# Patient Record
Sex: Female | Born: 1980
Health system: Southern US, Community
[De-identification: ages and names within clinical notes are randomized; demographics above are authoritative.]

## PROBLEM LIST (undated history)

## (undated) DIAGNOSIS — A6000 Herpesviral infection of urogenital system, unspecified: Secondary | ICD-10-CM

## (undated) DIAGNOSIS — D219 Benign neoplasm of connective and other soft tissue, unspecified: Secondary | ICD-10-CM

## (undated) DIAGNOSIS — N809 Endometriosis, unspecified: Secondary | ICD-10-CM

## (undated) HISTORY — DX: Herpesviral infection of urogenital system, unspecified: A60.00

## (undated) HISTORY — PX: DILATION AND CURETTAGE OF UTERUS: SHX78

## (undated) HISTORY — DX: Endometriosis, unspecified: N80.9

## (undated) HISTORY — PX: PELVIC LAPAROSCOPY: SHX162

## (undated) HISTORY — PX: ENDOMETRIAL ABLATION: SHX621

## (undated) HISTORY — DX: Benign neoplasm of connective and other soft tissue, unspecified: D21.9

---

## 2004-06-28 ENCOUNTER — Emergency Department: Payer: Self-pay | Admitting: Emergency Medicine

## 2004-06-29 ENCOUNTER — Emergency Department: Payer: Self-pay | Admitting: Emergency Medicine

## 2004-08-23 ENCOUNTER — Emergency Department: Payer: Self-pay | Admitting: Emergency Medicine

## 2005-08-17 ENCOUNTER — Observation Stay: Payer: Self-pay

## 2005-09-06 ENCOUNTER — Emergency Department: Payer: Self-pay | Admitting: Emergency Medicine

## 2005-10-28 ENCOUNTER — Observation Stay: Payer: Self-pay

## 2005-12-11 ENCOUNTER — Inpatient Hospital Stay: Payer: Self-pay | Admitting: Unknown Physician Specialty

## 2005-12-24 ENCOUNTER — Emergency Department: Payer: Self-pay | Admitting: Emergency Medicine

## 2006-04-23 ENCOUNTER — Emergency Department: Payer: Self-pay | Admitting: Emergency Medicine

## 2006-07-21 ENCOUNTER — Emergency Department: Payer: Self-pay | Admitting: Emergency Medicine

## 2007-10-03 ENCOUNTER — Emergency Department: Payer: Self-pay | Admitting: Emergency Medicine

## 2007-10-31 ENCOUNTER — Emergency Department: Payer: Self-pay | Admitting: Emergency Medicine

## 2008-02-14 ENCOUNTER — Emergency Department: Payer: Self-pay | Admitting: Emergency Medicine

## 2009-02-10 DIAGNOSIS — A6000 Herpesviral infection of urogenital system, unspecified: Secondary | ICD-10-CM | POA: Insufficient documentation

## 2009-07-16 DIAGNOSIS — J309 Allergic rhinitis, unspecified: Secondary | ICD-10-CM | POA: Insufficient documentation

## 2009-07-16 DIAGNOSIS — I10 Essential (primary) hypertension: Secondary | ICD-10-CM | POA: Insufficient documentation

## 2009-07-16 DIAGNOSIS — D649 Anemia, unspecified: Secondary | ICD-10-CM | POA: Insufficient documentation

## 2010-03-20 ENCOUNTER — Emergency Department (HOSPITAL_COMMUNITY): Admission: EM | Admit: 2010-03-20 | Discharge: 2010-03-20 | Payer: Self-pay | Admitting: Emergency Medicine

## 2010-06-07 ENCOUNTER — Ambulatory Visit: Payer: Self-pay | Admitting: Unknown Physician Specialty

## 2010-06-11 ENCOUNTER — Ambulatory Visit: Payer: Self-pay | Admitting: Unknown Physician Specialty

## 2010-06-15 LAB — PATHOLOGY REPORT

## 2010-12-05 LAB — URINALYSIS, ROUTINE W REFLEX MICROSCOPIC
Bilirubin Urine: NEGATIVE
Leukocytes, UA: NEGATIVE
Nitrite: NEGATIVE
Protein, ur: NEGATIVE mg/dL
Specific Gravity, Urine: 1.031 — ABNORMAL HIGH (ref 1.005–1.030)

## 2010-12-05 LAB — COMPREHENSIVE METABOLIC PANEL
ALT: 14 U/L (ref 0–35)
Albumin: 3.5 g/dL (ref 3.5–5.2)
Alkaline Phosphatase: 69 U/L (ref 39–117)
CO2: 27 mEq/L (ref 19–32)
Calcium: 8.7 mg/dL (ref 8.4–10.5)
Chloride: 108 mEq/L (ref 96–112)
Total Bilirubin: 0.3 mg/dL (ref 0.3–1.2)

## 2010-12-05 LAB — DIFFERENTIAL
Basophils Absolute: 0 10*3/uL (ref 0.0–0.1)
Eosinophils Absolute: 0.1 10*3/uL (ref 0.0–0.7)
Monocytes Relative: 6 % (ref 3–12)
Neutrophils Relative %: 85 % — ABNORMAL HIGH (ref 43–77)

## 2010-12-05 LAB — URINE MICROSCOPIC-ADD ON

## 2010-12-05 LAB — CBC
MCH: 27.7 pg (ref 26.0–34.0)
MCHC: 32.9 g/dL (ref 30.0–36.0)
MCV: 84.2 fL (ref 78.0–100.0)
RBC: 4.63 MIL/uL (ref 3.87–5.11)
RDW: 16.7 % — ABNORMAL HIGH (ref 11.5–15.5)

## 2010-12-05 LAB — POCT PREGNANCY, URINE: Preg Test, Ur: NEGATIVE

## 2012-03-12 ENCOUNTER — Emergency Department: Payer: Self-pay | Admitting: Emergency Medicine

## 2012-03-12 LAB — COMPREHENSIVE METABOLIC PANEL
BUN: 12 mg/dL (ref 7–18)
Co2: 27 mmol/L (ref 21–32)
Creatinine: 0.93 mg/dL (ref 0.60–1.30)
EGFR (African American): >60
EGFR (Non-African Amer.): >60
Glucose: 88 mg/dL (ref 65–99)
Osmolality: 269 (ref 275–301)
Potassium: 3.7 mmol/L (ref 3.5–5.1)
SGPT (ALT): 16 U/L
Sodium: 135 mmol/L — ABNORMAL LOW (ref 136–145)

## 2012-03-12 LAB — CBC
HGB: 13.1 g/dL (ref 12.0–16.0)
MCH: 28.9 pg (ref 26.0–34.0)
Platelet: 194 10*3/uL (ref 150–440)
RDW: 12.7 % (ref 11.5–14.5)

## 2012-03-12 LAB — TSH: Thyroid Stimulating Horm: 3.01 u[IU]/mL

## 2012-03-12 LAB — TROPONIN I: Troponin-I: <0.02 ng/mL

## 2013-09-19 HISTORY — PX: OTHER SURGICAL HISTORY: SHX169

## 2014-04-28 ENCOUNTER — Ambulatory Visit: Payer: Self-pay | Admitting: Obstetrics and Gynecology

## 2014-04-28 LAB — CBC
HCT: 41.1 % (ref 35.0–47.0)
HGB: 13.2 g/dL (ref 12.0–16.0)
MCH: 28.1 pg (ref 26.0–34.0)
MCHC: 32.2 g/dL (ref 32.0–36.0)
MCV: 87 fL (ref 80–100)
Platelet: 193 10*3/uL (ref 150–440)
RBC: 4.7 10*6/uL (ref 3.80–5.20)
RDW: 13 % (ref 11.5–14.5)
WBC: 4 10*3/uL (ref 3.6–11.0)

## 2014-04-28 LAB — BASIC METABOLIC PANEL
ANION GAP: 4 — AB (ref 7–16)
BUN: 8 mg/dL (ref 7–18)
CHLORIDE: 106 mmol/L (ref 98–107)
Calcium, Total: 8.8 mg/dL (ref 8.5–10.1)
Co2: 28 mmol/L (ref 21–32)
Creatinine: 1 mg/dL (ref 0.60–1.30)
EGFR (African American): >60
Glucose: 91 mg/dL (ref 65–99)
OSMOLALITY: 274 (ref 275–301)
POTASSIUM: 3.8 mmol/L (ref 3.5–5.1)
Sodium: 138 mmol/L (ref 136–145)

## 2014-05-08 ENCOUNTER — Ambulatory Visit: Payer: Self-pay | Admitting: Obstetrics and Gynecology

## 2014-05-09 LAB — HEMATOCRIT: HCT: 34.7 % — ABNORMAL LOW (ref 35.0–47.0)

## 2014-05-11 LAB — PATHOLOGY REPORT

## 2015-01-01 LAB — CBC AND DIFFERENTIAL
HEMATOCRIT: 42 % (ref 36–46)
Hemoglobin: 14.1 g/dL (ref 12.0–16.0)
NEUTROS ABS: 2 /uL
PLATELETS: 240 10*3/uL (ref 150–399)
WBC: 4.4 10^3/mL

## 2015-01-01 LAB — HEPATIC FUNCTION PANEL
ALT: 10 U/L (ref 7–35)
AST: 21 U/L (ref 13–35)
Alkaline Phosphatase: 69 U/L (ref 25–125)

## 2015-01-01 LAB — LIPID PANEL
CHOLESTEROL: 169 mg/dL (ref 0–200)
HDL: 40 mg/dL (ref 35–70)
LDL CALC: 111 mg/dL
LDL/HDL RATIO: 2.8
TRIGLYCERIDES: 89 mg/dL (ref 40–160)

## 2015-01-01 LAB — BASIC METABOLIC PANEL
BUN: 8 mg/dL (ref 4–21)
Creatinine: 0.8 mg/dL (ref 0.5–1.1)
Glucose: 97 mg/dL
Potassium: 4.1 mmol/L (ref 3.4–5.3)
SODIUM: 135 mmol/L — AB (ref 137–147)

## 2015-01-01 LAB — TSH: TSH: 1.39 u[IU]/mL (ref 0.41–5.90)

## 2015-01-10 NOTE — Op Note (Signed)
PATIENT NAME:  Daisy Foley, Daisy Foley MR#:  [id number] DATE OF BIRTH:  August 18, 1981  DATE OF PROCEDURE:  05/08/2014  PREOPERATIVE DIAGNOSIS: Pelvic pain and large fibroid causing metromenorrhagia unresponsive to medical management and desiring permanent sterility.   POSTOPERATIVE DIAGNOSIS:  Pelvic pain and large fibroid causing metromenorrhagia unresponsive to medical management and desiring permanent sterility.    PROCEDURE PERFORMED: Laparoscopic supracervical hysterectomy with minilaparotomy and bilateral salpingectomy.   ESTIMATED BLOOD LOSS: Approximately 100 mL.   SURGEON: Delsa Sale, MD    ASSISTANT: Barnett Applebaum, MD    FINDINGS: Large fibroid uterus with solitary fibroid, normal tubes and ovaries bilaterally.   DESCRIPTION OF PROCEDURE:  The patient was taken to the Operating Room and placed in supine position. After adequate general endotracheal anesthesia was instilled, the patient was prepped and draped in the usual sterile fashion.  A side-opening speculum was placed in the patient's vagina and the anterior lip of the cervix was grasped with a single-tooth tenaculum. Hulka tenaculum was placed, speculum was removed from the vagina.  Attention was turned to the umbilicus which was injected with Marcaine.  Incision was made.  Veress needle was placed. Hang drop test, fluid instillation test, and fluid aspiration test showed proper placement of Veress needle. CO2 was placed on low flow.  When tympany was heard around the liver, CO2 was placed on high flow. The patient was then placed in Trendelenburg.  An 11 mm trocar port was placed in the umbilicus and two 5-mm trocars were placed on the left and right lower quadrant. The tubes were doused with Marcaine.  Harmonic scalpel was placed.  First the tube was released from the peritoneum, 1st on the right and then on the left.  Serial bites were taken down the sides of the uterus with the Harmonic scalpel.  The uterine artery was grasped with the  Kleppinger and cauterized.  The Harmonic scalpel then used to cut across the base of the uterus at the uterocervical junction and the patient was then placed flat and supine and a small mini laparotomy incision was made approximately 2 fingerbreadths above the pubic symphysis and carried sharply down to the fascia.  Fascia was nicked in the midline. The incision was extended in a superolateral manner. The muscle bellies were identified and sharply and bluntly dissected off the rectus fascia superiorly. The peritoneum was grasped and entered. The uterus was grasped with the surgeon's fingers and pulled out through the incision.  The muscle bellies were approximated with Vicryl suture.  The fascia was closed with a running Vicryl suture.  Then 4-0 Monocryl was used to approximate the skin edges. The pelvis was copiously irrigated with warm normal saline to the laparoscopy port and the pelvis was found to be hemostatic.  The patient was then allowed to go supine.  Clear urine was noted in the Foley bag.  Hulka tenaculum had been removed from the cervix with removal of the uterus. 4-0 Monocryl was used to close the skin edges.  Dermabond, 2 x 2's and Tegaderm were placed as bandages.  The patient was then taken to recovery after having tolerated the procedure well.      ____________________________ Delsa Sale, MD cck:DT D: 05/13/2014 13:03:35 ET T: 05/13/2014 19:58:12 ET JOB#: [id number]  cc: Delsa Sale, MD, <Dictator> Delsa Sale MD ELECTRONICALLY SIGNED 05/13/2014 20:35

## 2015-06-29 ENCOUNTER — Encounter: Payer: Self-pay | Admitting: Family Medicine

## 2015-06-29 ENCOUNTER — Ambulatory Visit (INDEPENDENT_AMBULATORY_CARE_PROVIDER_SITE_OTHER): Payer: 59 | Admitting: Family Medicine

## 2015-06-29 VITALS — BP 124/70 | HR 68 | Temp 98.1°F | Resp 16 | Ht 72.0 in | Wt 234.0 lb

## 2015-06-29 DIAGNOSIS — G43909 Migraine, unspecified, not intractable, without status migrainosus: Secondary | ICD-10-CM | POA: Insufficient documentation

## 2015-06-29 DIAGNOSIS — K2100 Gastro-esophageal reflux disease with esophagitis, without bleeding: Secondary | ICD-10-CM | POA: Insufficient documentation

## 2015-06-29 DIAGNOSIS — M542 Cervicalgia: Secondary | ICD-10-CM

## 2015-06-29 DIAGNOSIS — E039 Hypothyroidism, unspecified: Secondary | ICD-10-CM | POA: Insufficient documentation

## 2015-06-29 DIAGNOSIS — F988 Other specified behavioral and emotional disorders with onset usually occurring in childhood and adolescence: Secondary | ICD-10-CM | POA: Insufficient documentation

## 2015-06-29 DIAGNOSIS — E7800 Pure hypercholesterolemia, unspecified: Secondary | ICD-10-CM | POA: Insufficient documentation

## 2015-06-29 DIAGNOSIS — F329 Major depressive disorder, single episode, unspecified: Secondary | ICD-10-CM | POA: Insufficient documentation

## 2015-06-29 DIAGNOSIS — F32A Depression, unspecified: Secondary | ICD-10-CM | POA: Insufficient documentation

## 2015-06-29 MED ORDER — HYDROCODONE-ACETAMINOPHEN 5-325 MG PO TABS: 1.0000 | ORAL_TABLET | ORAL | Status: DC | PRN

## 2015-06-29 MED ORDER — CYCLOBENZAPRINE HCL 10 MG PO TABS: 10.0000 mg | ORAL_TABLET | Freq: Three times a day (TID) | ORAL | Status: DC | PRN

## 2015-06-29 MED ORDER — PREDNISONE 10 MG PO TABS: 10.0000 mg | ORAL_TABLET | Freq: Every day | ORAL | Status: DC

## 2015-06-29 NOTE — Progress Notes (Signed)
Patient ID: Daisy Foley, female   DOB: 1980/10/09, 34 y.o.   MRN: [id number]   Daisy Foley  MRN: [id number] DOB: [birth year]  Subjective:  HPI   1. Neck pain Patient is a 34 year old female who presents today for evalulation of neck/bilateral shoulder pain.  The patient is massage therapist 3 days per week.  She notes that she had been having some right sided shoulder pain (dominant arm) and had a massage 6 days ago.  After the massage she states she felt well.  On Thursday she went to work and had 5 patient, 3 of which were deep tissue massages.  She notes that at 3 am on Friday morning she was asleep and rolled over on her left side.  She was awakened with sever sharp pain that went from her right shoulder to the left shoulder.  She has been in significant pain since that time.  She states that she tried ice, heat, massage and marijuana and nothing has helped the pain.  She is unable to turn her head side to side, up and down and can only lift her arms a little.  The pain does not radiate down her back.  Patient Active Problem List   Diagnosis Date Noted  . ADD (attention deficit disorder) 06/29/2015  . Clinical depression 06/29/2015  . Esophagitis, reflux 06/29/2015  . Adult hypothyroidism 06/29/2015  . Headache, migraine 06/29/2015  . Pure hypercholesterolemia 06/29/2015  . Allergic rhinitis 07/16/2009  . Absolute anemia 07/16/2009  . Essential (primary) hypertension 07/16/2009  . Genital herpes 02/10/2009    No past medical history on file.  Social History   Social History  . Marital Status: Divorced    Spouse Name: N/A  . Number of Children: N/A  . Years of Education: N/A   Occupational History  . Not on file.   Social History Main Topics  . Smoking status: Never Smoker   . Smokeless tobacco: Not on file  . Alcohol Use: No     Comment: 3 per month  . Drug Use: No     Comment: marijuana use twice this past weekend  . Sexual Activity: Not on file   Other Topics  Concern  . Not on file   Social History Narrative    Outpatient Prescriptions Prior to Visit  Medication Sig Dispense Refill  . acyclovir (ZOVIRAX) 400 MG tablet Take by mouth.     No facility-administered medications prior to visit.    No Known Allergies  Review of Systems  Constitutional: Positive for chills and malaise/fatigue. Negative for fever.  Respiratory: Negative for cough, shortness of breath and wheezing.   Cardiovascular: Positive for orthopnea. Negative for chest pain, palpitations and leg swelling.  Gastrointestinal: Negative.   Musculoskeletal: Positive for myalgias, joint pain (shoulders) and neck pain. Negative for back pain.  Neurological: Negative.  Negative for weakness.  Psychiatric/Behavioral: Negative.    Objective:  BP 124/70 mmHg  Pulse 68  Temp(Src) 98.1 F (36.7 C) (Oral)  Resp 16  Ht 6' (1.829 m)  Wt 234 lb (106.142 kg)  BMI 31.73 kg/m2  Physical Exam  Constitutional: She is oriented to person, place, and time and well-developed, well-nourished, and in no distress.  HENT:  Head: Normocephalic and atraumatic.  Right Ear: External ear normal.  Left Ear: External ear normal.  Nose: Nose normal.  Eyes: Conjunctivae are normal.  Neck: Neck supple.  Cardiovascular: Normal rate, regular rhythm and normal heart sounds.   Pulmonary/Chest: Effort normal and breath sounds normal.  Abdominal: Soft.  Neurological: She is alert and oriented to person, place, and time.  Skin: Skin is warm and dry.  Psychiatric: Mood, memory, affect and judgment normal.    Assessment and Plan :  Neck pain 1. Neck pain Acute Muscular Strain/doubt cervical DDD. - predniSONE (DELTASONE) 10 MG tablet; Take 1 tablet (10 mg total) by mouth daily with breakfast.  Dispense: 42 tablet; Refill: 0 - cyclobenzaprine (FLEXERIL) 10 MG tablet; Take 1 tablet (10 mg total) by mouth 3 (three) times daily as needed for muscle spasms.  Dispense: 55 tablet; Refill: 0 -  HYDROcodone-acetaminophen (NORCO/VICODIN) 5-325 MG tablet; Take 1 tablet by mouth every 4 (four) hours as needed for moderate pain.  Dispense: 55 tablet; Refill: 0 I have done the exam and reviewed the above chart and it is accurate to the best of my knowledge.  Miguel Aschoff MD [facility] 06/29/2015 2:08 PM

## 2015-07-03 ENCOUNTER — Encounter: Payer: Self-pay | Admitting: Family Medicine

## 2015-08-11 ENCOUNTER — Encounter: Payer: Self-pay | Admitting: Family Medicine

## 2015-08-11 ENCOUNTER — Ambulatory Visit (INDEPENDENT_AMBULATORY_CARE_PROVIDER_SITE_OTHER): Payer: 59 | Admitting: Family Medicine

## 2015-08-11 VITALS — BP 112/72 | HR 74 | Temp 97.9°F | Resp 10 | Wt 240.0 lb

## 2015-08-11 DIAGNOSIS — R599 Enlarged lymph nodes, unspecified: Secondary | ICD-10-CM

## 2015-08-11 DIAGNOSIS — R590 Localized enlarged lymph nodes: Secondary | ICD-10-CM

## 2015-08-11 DIAGNOSIS — M542 Cervicalgia: Secondary | ICD-10-CM | POA: Diagnosis not present

## 2015-08-11 MED ORDER — CYCLOBENZAPRINE HCL 10 MG PO TABS: 10.0000 mg | ORAL_TABLET | Freq: Three times a day (TID) | ORAL | Status: DC | PRN

## 2015-08-11 NOTE — Progress Notes (Signed)
Patient ID: Daisy Foley, female   DOB: 03/05/1981, 34 y.o.   MRN: [id number]    Subjective:  HPI  Patient is here to follow up on neck pain. She was last seen on October 10th. At that time she was given Prednisone, Flexeril and Vicodin. Patient finished Prednisone course, used Vicodin a few times. She has been taking Flexeril some and when she takes it neck pain is better. Pain is located on the left side of the neck. Feels swollen in that area. Saturday November 19th she had a headache and felt a little dizzy, her colleague at work gave her a massage in the neck area and it actually made it worse. She has noticed headaches before Saturday a couple times and they would last for about 3 hours -sometimes she will wait it out and sometimes she will take Advil or something. She is trying not to take Flexeril daily and not become addicted to it.  Prior to Admission medications   Medication Sig Start Date End Date Taking? Authorizing Provider  cyclobenzaprine (FLEXERIL) 10 MG tablet Take 1 tablet (10 mg total) by mouth 3 (three) times daily as needed for muscle spasms. 06/29/15  Yes Henderson Frampton Maceo Pro., MD  HYDROcodone-acetaminophen (NORCO/VICODIN) 5-325 MG tablet Take 1 tablet by mouth every 4 (four) hours as needed for moderate pain. 06/29/15  Yes Skylah Delauter Maceo Pro., MD    Patient Active Problem List   Diagnosis Date Noted  . ADD (attention deficit disorder) 06/29/2015  . Clinical depression 06/29/2015  . Esophagitis, reflux 06/29/2015  . Adult hypothyroidism 06/29/2015  . Headache, migraine 06/29/2015  . Pure hypercholesterolemia 06/29/2015  . Allergic rhinitis 07/16/2009  . Absolute anemia 07/16/2009  . Essential (primary) hypertension 07/16/2009  . Genital herpes 02/10/2009    No past medical history on file.  Social History   Social History  . Marital Status: Divorced    Spouse Name: N/A  . Number of Children: N/A  . Years of Education: N/A   Occupational History  .  Not on file.   Social History Main Topics  . Smoking status: Never Smoker   . Smokeless tobacco: Never Used  . Alcohol Use: No     Comment: 3 per month  . Drug Use: No     Comment: marijuana use twice this past weekend  . Sexual Activity: Not on file   Other Topics Concern  . Not on file   Social History Narrative    No Known Allergies  Review of Systems  Constitutional: Negative.   HENT: Negative.   Respiratory: Negative.   Cardiovascular: Negative.   Gastrointestinal: Negative.   Musculoskeletal: Positive for neck pain.  Skin: Negative.   Neurological: Negative.   Endo/Heme/Allergies: Negative.   Psychiatric/Behavioral: Negative.     Immunization History  Administered Date(s) Administered  . Tdap 08/30/2012   Objective:  BP 112/72 mmHg  Pulse 74  Temp(Src) 97.9 F (36.6 C)  Resp 10  Wt 240 lb (108.863 kg)  Physical Exam  Constitutional: She is oriented to person, place, and time and well-developed, well-nourished, and in no distress.  HENT:  Head: Normocephalic and atraumatic.  Right Ear: External ear normal.  Left Ear: External ear normal.  Nose: Nose normal.  Eyes: Conjunctivae are normal.  Neck: Neck supple. No tracheal deviation present. No thyromegaly present.  She has 3 small mobile posterior cervical lymph node in the upper posterior chain on the left.  Cardiovascular: Normal rate, regular rhythm and normal heart sounds.  Pulmonary/Chest: Effort normal and breath sounds normal.  Lymphadenopathy:    She has cervical adenopathy.  Neurological: She is alert and oriented to person, place, and time.  Skin: Skin is warm and dry.  Psychiatric: Mood, memory, affect and judgment normal.    Lab Results  Component Value Date   WBC 4.4 01/01/2015   HGB 14.1 01/01/2015   HCT 42 01/01/2015   PLT 240 01/01/2015   GLUCOSE 91 04/28/2014   CHOL 169 01/01/2015   TRIG 89 01/01/2015   HDL 40 01/01/2015   LDLCALC 111 01/01/2015   TSH 1.39 01/01/2015     CMP     Component Value Date/Time   NA 135* 01/01/2015   NA 138 04/28/2014 1107   NA 138 03/20/2010 1243   K 4.1 01/01/2015   K 3.8 04/28/2014 1107   CL 106 04/28/2014 1107   CL 108 03/20/2010 1243   CO2 28 04/28/2014 1107   CO2 27 03/20/2010 1243   GLUCOSE 91 04/28/2014 1107   GLUCOSE 101* 03/20/2010 1243   BUN 8 01/01/2015   BUN 8 04/28/2014 1107   BUN 6 03/20/2010 1243   CREATININE 0.8 01/01/2015   CREATININE 1.00 04/28/2014 1107   CREATININE 0.64 03/20/2010 1243   CALCIUM 8.8 04/28/2014 1107   CALCIUM 8.7 03/20/2010 1243   PROT 9.1* 03/12/2012 0613   PROT 8.6* 03/20/2010 1243   ALBUMIN 3.7 03/12/2012 0613   ALBUMIN 3.5 03/20/2010 1243   AST 21 01/01/2015   AST 21 03/12/2012 0613   ALT 10 01/01/2015   ALT 16 03/12/2012 0613   ALKPHOS 69 01/01/2015   ALKPHOS 85 03/12/2012 0613   BILITOT 0.2 03/12/2012 0613   BILITOT 0.3 03/20/2010 1243   GFRNONAA >60 04/28/2014 1107   GFRNONAA NOT CALCULATED 03/20/2010 1243   GFRAA >60 04/28/2014 1107   GFRAA  03/20/2010 1243    NOT CALCULATED        The eGFR has been calculated using the MDRD equation. This calculation has not been validated in all clinical situations. eGFR's persistently <60 mL/min signify possible Chronic Kidney Disease.    Assessment and Plan :  1. Neck pain Still present but markedly improved.  2. Posterior cervical lymphadenopathy/left From viral infection usually. Continue Flexeril as needed. May need to check labs on the next visit. If symptoms improve patient can cancel follow up appointment. These feel to be benign nodes. I have done the exam and reviewed the above chart and it is accurate to the best of my knowledge.  Patient was seen and examined by Dr. Eulas Post and note was scribed by Theressa Millard, RMA.    Miguel Aschoff MD [facility] 08/11/2015 4:26 PM

## 2015-09-09 ENCOUNTER — Ambulatory Visit: Payer: 59 | Admitting: Family Medicine

## 2015-10-08 ENCOUNTER — Other Ambulatory Visit: Payer: Self-pay

## 2016-02-10 ENCOUNTER — Other Ambulatory Visit: Payer: Self-pay | Admitting: Family Medicine

## 2016-11-23 ENCOUNTER — Ambulatory Visit (INDEPENDENT_AMBULATORY_CARE_PROVIDER_SITE_OTHER): Payer: 59 | Admitting: Family Medicine

## 2016-11-23 VITALS — BP 138/84 | HR 68 | Temp 98.6°F | Resp 16 | Wt 228.0 lb

## 2016-11-23 DIAGNOSIS — A6004 Herpesviral vulvovaginitis: Secondary | ICD-10-CM | POA: Diagnosis not present

## 2016-11-23 DIAGNOSIS — T63301A Toxic effect of unspecified spider venom, accidental (unintentional), initial encounter: Secondary | ICD-10-CM

## 2016-11-23 DIAGNOSIS — L03114 Cellulitis of left upper limb: Secondary | ICD-10-CM | POA: Diagnosis not present

## 2016-11-23 MED ORDER — ACYCLOVIR 400 MG PO TABS: 400.0000 mg | ORAL_TABLET | Freq: Two times a day (BID) | ORAL | 5 refills | Status: DC

## 2016-11-23 MED ORDER — AMOXICILLIN-POT CLAVULANATE 875-125 MG PO TABS: 1.0000 | ORAL_TABLET | Freq: Two times a day (BID) | ORAL | 0 refills | Status: DC

## 2016-11-23 NOTE — Progress Notes (Signed)
Daisy Foley  MRN: [id number] DOB: 12-01-1980  Subjective:  HPI   The patient is a 36 year old female who presents for evaluation of a spider bite.  She states she was asleep in the early morning hours of Tuesday (1 day) ago when she felt something on her arm.  She states she did not get up and didn't even wake up all the way but felt it and swatted at it.  When she got to work that morning she had a co-worker to check the area as it was hurting  and itching her.  It was noted to be red and swollen.  The drew a circle around the size of the reddened area.  She states that today it is bigger, more pain, throbbing and has heat in it.  She was not able to identify the insect.  Patient Active Problem List   Diagnosis Date Noted  . ADD (attention deficit disorder) 06/29/2015  . Clinical depression 06/29/2015  . Esophagitis, reflux 06/29/2015  . Adult hypothyroidism 06/29/2015  . Headache, migraine 06/29/2015  . Pure hypercholesterolemia 06/29/2015  . Allergic rhinitis 07/16/2009  . Absolute anemia 07/16/2009  . Essential (primary) hypertension 07/16/2009  . Genital herpes 02/10/2009    No past medical history on file.  Social History   Social History  . Marital status: Divorced    Spouse name: N/A  . Number of children: N/A  . Years of education: N/A   Occupational History  . Not on file.   Social History Main Topics  . Smoking status: Never Smoker  . Smokeless tobacco: Never Used  . Alcohol use No     Comment: 3 per month  . Drug use: No     Comment: marijuana use twice this past weekend  . Sexual activity: Not on file   Other Topics Concern  . Not on file   Social History Narrative  . No narrative on file    Outpatient Encounter Prescriptions as of 11/23/2016  Medication Sig  . acyclovir (ZOVIRAX) 400 MG tablet TAKE 1 TABLET BY MOUTH TWICE DAILY  . [DISCONTINUED] cyclobenzaprine (FLEXERIL) 10 MG tablet Take 1 tablet (10 mg total) by mouth 3 (three) times daily  as needed for muscle spasms.  . [DISCONTINUED] HYDROcodone-acetaminophen (NORCO/VICODIN) 5-325 MG tablet Take 1 tablet by mouth every 4 (four) hours as needed for moderate pain.   No facility-administered encounter medications on file as of 11/23/2016.     No Known Allergies  Review of Systems  Constitutional: Negative for chills, fever and malaise/fatigue.  Respiratory: Negative for cough, shortness of breath and wheezing.   Cardiovascular: Negative for chest pain, palpitations and orthopnea.  Skin: Positive for itching. Negative for rash.       Red and swollen on left, medial upper arm  Neurological: Negative for weakness.    Objective:  BP 138/84 (BP Location: Right Arm, Patient Position: Sitting, Cuff Size: Normal)   Pulse 68   Temp 98.6 F (37 C) (Oral)   Resp 16   Wt 228 lb (103.4 kg)   BMI 30.92 kg/m   Physical Exam  Constitutional: She is well-developed, well-nourished, and in no distress.  Neck:  6 x 5 ' indurated, erythematous, and warm to the touch aerea just medial to the left elbow.  Cardiovascular: Normal rate, regular rhythm and normal heart sounds.   Pulmonary/Chest: Effort normal and breath sounds normal.    Assessment and Plan :   1. Cellulitis of left upper extremity  - amoxicillin-clavulanate (  AUGMENTIN) 875-125 MG tablet; Take 1 tablet by mouth 2 (two) times daily.  Dispense: 20 tablet; Refill: 0  2. Spider bite wound, accidental or unintentional, initial encounter  3. Herpes simplex vulvovaginitis    HPI, Exam and A&P Transcribed under the direction and in the presence of [name]., MD. Electronically Signed: Althea Charon, RMA I have done the exam and reviewed the chart and it is accurate to the best of my knowledge. Development worker, community has been used and  any errors in dictation or transcription are unintentional. Miguel Aschoff M.D. [facility]

## 2016-11-24 ENCOUNTER — Ambulatory Visit: Payer: Self-pay | Admitting: Family Medicine

## 2016-12-29 ENCOUNTER — Encounter: Payer: Self-pay | Admitting: Obstetrics and Gynecology

## 2016-12-29 ENCOUNTER — Ambulatory Visit (INDEPENDENT_AMBULATORY_CARE_PROVIDER_SITE_OTHER): Payer: 59 | Admitting: Obstetrics and Gynecology

## 2016-12-29 VITALS — BP 128/81 | HR 94 | Ht 72.0 in | Wt 225.4 lb

## 2016-12-29 DIAGNOSIS — N939 Abnormal uterine and vaginal bleeding, unspecified: Secondary | ICD-10-CM

## 2016-12-29 DIAGNOSIS — Z202 Contact with and (suspected) exposure to infections with a predominantly sexual mode of transmission: Secondary | ICD-10-CM

## 2016-12-29 DIAGNOSIS — R599 Enlarged lymph nodes, unspecified: Secondary | ICD-10-CM | POA: Insufficient documentation

## 2016-12-29 DIAGNOSIS — Z01419 Encounter for gynecological examination (general) (routine) without abnormal findings: Secondary | ICD-10-CM

## 2016-12-29 MED ORDER — DOXYCYCLINE HYCLATE 100 MG PO CAPS: 100.0000 mg | ORAL_CAPSULE | Freq: Two times a day (BID) | ORAL | 0 refills | Status: DC

## 2016-12-29 NOTE — Patient Instructions (Signed)
1. Pap smear and STD testing is obtained 2. Self breast awareness is encouraged 3. Continue with healthy eating and exercise with controlled weight loss 4. Safe sex practices are encouraged 5. Return in 3 months for follow-up on abnormal uterine bleeding and right axillary lymph node enlargement 6. Doxycycline 100 mg twice a day for 14 days is prescribed 7. Return in 1 year for annual exam  Health Maintenance, Female Adopting a healthy lifestyle and getting preventive care can go a long way to promote health and wellness. Talk with your health care provider about what schedule of regular examinations is right for you. This is a good chance for you to check in with your provider about disease prevention and staying healthy. In between checkups, there are plenty of things you can do on your own. Experts have done a lot of research about which lifestyle changes and preventive measures are most likely to keep you healthy. Ask your health care provider for more information. Weight and diet Eat a healthy diet  Be sure to include plenty of vegetables, fruits, low-fat dairy products, and lean protein.  Do not eat a lot of foods high in solid fats, added sugars, or salt.  Get regular exercise. This is one of the most important things you can do for your health.  Most adults should exercise for at least 150 minutes each week. The exercise should increase your heart rate and make you sweat (moderate-intensity exercise).  Most adults should also do strengthening exercises at least twice a week. This is in addition to the moderate-intensity exercise. Maintain a healthy weight  Body mass index (BMI) is a measurement that can be used to identify possible weight problems. It estimates body fat based on height and weight. Your health care provider can help determine your BMI and help you achieve or maintain a healthy weight.  For females 37 years of age and older:  A BMI below 18.5 is considered  underweight.  A BMI of 18.5 to 24.9 is normal.  A BMI of 25 to 29.9 is considered overweight.  A BMI of 30 and above is considered obese. Watch levels of cholesterol and blood lipids  You should start having your blood tested for lipids and cholesterol at 34 years of age, then have this test every 5 years.  You may need to have your cholesterol levels checked more often if:  Your lipid or cholesterol levels are high.  You are older than 34 years of age.  You are at high risk for heart disease. Cancer screening Lung Cancer  Lung cancer screening is recommended for adults 60-72 years old who are at high risk for lung cancer because of a history of smoking.  A yearly low-dose CT scan of the lungs is recommended for people who:  Currently smoke.  Have quit within the past 15 years.  Have at least a 30-pack-year history of smoking. A pack year is smoking an average of one pack of cigarettes a day for 1 year.  Yearly screening should continue until it has been 15 years since you quit.  Yearly screening should stop if you develop a health problem that would prevent you from having lung cancer treatment. Breast Cancer  Practice breast self-awareness. This means understanding how your breasts normally appear and feel.  It also means doing regular breast self-exams. Let your health care provider know about any changes, no matter how small.  If you are in your 20s or 30s, you should have a clinical  breast exam (CBE) by a health care provider every 1-3 years as part of a regular health exam.  If you are 105 or older, have a CBE every year. Also consider having a breast X-ray (mammogram) every year.  If you have a family history of breast cancer, talk to your health care provider about genetic screening.  If you are at high risk for breast cancer, talk to your health care provider about having an MRI and a mammogram every year.  Breast cancer gene (BRCA) assessment is recommended  for women who have family members with BRCA-related cancers. BRCA-related cancers include:  Breast.  Ovarian.  Tubal.  Peritoneal cancers.  Results of the assessment will determine the need for genetic counseling and BRCA1 and BRCA2 testing. Cervical Cancer  Your health care provider may recommend that you be screened regularly for cancer of the pelvic organs (ovaries, uterus, and vagina). This screening involves a pelvic examination, including checking for microscopic changes to the surface of your cervix (Pap test). You may be encouraged to have this screening done every 3 years, beginning at age 67.  For women ages 1-65, health care providers may recommend pelvic exams and Pap testing every 3 years, or they may recommend the Pap and pelvic exam, combined with testing for human papilloma virus (HPV), every 5 years. Some types of HPV increase your risk of cervical cancer. Testing for HPV may also be done on women of any age with unclear Pap test results.  Other health care providers may not recommend any screening for nonpregnant women who are considered low risk for pelvic cancer and who do not have symptoms. Ask your health care provider if a screening pelvic exam is right for you.  If you have had past treatment for cervical cancer or a condition that could lead to cancer, you need Pap tests and screening for cancer for at least 20 years after your treatment. If Pap tests have been discontinued, your risk factors (such as having a new sexual partner) need to be reassessed to determine if screening should resume. Some women have medical problems that increase the chance of getting cervical cancer. In these cases, your health care provider may recommend more frequent screening and Pap tests. Colorectal Cancer  This type of cancer can be detected and often prevented.  Routine colorectal cancer screening usually begins at 34 years of age and continues through 34 years of age.  Your health  care provider may recommend screening at an earlier age if you have risk factors for colon cancer.  Your health care provider may also recommend using home test kits to check for hidden blood in the stool.  A small camera at the end of a tube can be used to examine your colon directly (sigmoidoscopy or colonoscopy). This is done to check for the earliest forms of colorectal cancer.  Routine screening usually begins at age 26.  Direct examination of the colon should be repeated every 5-10 years through 34 years of age. However, you may need to be screened more often if early forms of precancerous polyps or small growths are found. Skin Cancer  Check your skin from head to toe regularly.  Tell your health care provider about any new moles or changes in moles, especially if there is a change in a mole's shape or color.  Also tell your health care provider if you have a mole that is larger than the size of a pencil eraser.  Always use sunscreen. Apply sunscreen liberally  and repeatedly throughout the day.  Protect yourself by wearing long sleeves, pants, a wide-brimmed hat, and sunglasses whenever you are outside. Heart disease, diabetes, and high blood pressure  High blood pressure causes heart disease and increases the risk of stroke. High blood pressure is more likely to develop in:  People who have blood pressure in the high end of the normal range (130-139/85-89 mm Hg).  People who are overweight or obese.  People who are African American.  If you are 67-60 years of age, have your blood pressure checked every 3-5 years. If you are 50 years of age or older, have your blood pressure checked every year. You should have your blood pressure measured twice-once when you are at a hospital or clinic, and once when you are not at a hospital or clinic. Record the average of the two measurements. To check your blood pressure when you are not at a hospital or clinic, you can use:  An automated  blood pressure machine at a pharmacy.  A home blood pressure monitor.  If you are between 71 years and 38 years old, ask your health care provider if you should take aspirin to prevent strokes.  Have regular diabetes screenings. This involves taking a blood sample to check your fasting blood sugar level.  If you are at a normal weight and have a low risk for diabetes, have this test once every three years after 34 years of age.  If you are overweight and have a high risk for diabetes, consider being tested at a younger age or more often. Preventing infection Hepatitis B  If you have a higher risk for hepatitis B, you should be screened for this virus. You are considered at high risk for hepatitis B if:  You were born in a country where hepatitis B is common. Ask your health care provider which countries are considered high risk.  Your parents were born in a high-risk country, and you have not been immunized against hepatitis B (hepatitis B vaccine).  You have HIV or AIDS.  You use needles to inject street drugs.  You live with someone who has hepatitis B.  You have had sex with someone who has hepatitis B.  You get hemodialysis treatment.  You take certain medicines for conditions, including cancer, organ transplantation, and autoimmune conditions. Hepatitis C  Blood testing is recommended for:  Everyone born from [birth year] through 1963.  Anyone with known risk factors for hepatitis C. Sexually transmitted infections (STIs)  You should be screened for sexually transmitted infections (STIs) including gonorrhea and chlamydia if:  You are sexually active and are younger than 34 years of age.  You are older than 34 years of age and your health care provider tells you that you are at risk for this type of infection.  Your sexual activity has changed since you were last screened and you are at an increased risk for chlamydia or gonorrhea. Ask your health care provider if you are at  risk.  If you do not have HIV, but are at risk, it may be recommended that you take a prescription medicine daily to prevent HIV infection. This is called pre-exposure prophylaxis (PrEP). You are considered at risk if:  You are sexually active and do not regularly use condoms or know the HIV status of your partner(s).  You take drugs by injection.  You are sexually active with a partner who has HIV. Talk with your health care provider about whether you are at high  risk of being infected with HIV. If you choose to begin PrEP, you should first be tested for HIV. You should then be tested every 3 months for as long as you are taking PrEP. Pregnancy  If you are premenopausal and you may become pregnant, ask your health care provider about preconception counseling.  If you may become pregnant, take 400 to 800 micrograms (mcg) of folic acid every day.  If you want to prevent pregnancy, talk to your health care provider about birth control (contraception). Osteoporosis and menopause  Osteoporosis is a disease in which the bones lose minerals and strength with aging. This can result in serious bone fractures. Your risk for osteoporosis can be identified using a bone density scan.  If you are 62 years of age or older, or if you are at risk for osteoporosis and fractures, ask your health care provider if you should be screened.  Ask your health care provider whether you should take a calcium or vitamin D supplement to lower your risk for osteoporosis.  Menopause may have certain physical symptoms and risks.  Hormone replacement therapy may reduce some of these symptoms and risks. Talk to your health care provider about whether hormone replacement therapy is right for you. Follow these instructions at home:  Schedule regular health, dental, and eye exams.  Stay current with your immunizations.  Do not use any tobacco products including cigarettes, chewing tobacco, or electronic  cigarettes.  If you are pregnant, do not drink alcohol.  If you are breastfeeding, limit how much and how often you drink alcohol.  Limit alcohol intake to no more than 1 drink per day for nonpregnant women. One drink equals 12 ounces of beer, 5 ounces of wine, or 1 ounces of hard liquor.  Do not use street drugs.  Do not share needles.  Ask your health care provider for help if you need support or information about quitting drugs.  Tell your health care provider if you often feel depressed.  Tell your health care provider if you have ever been abused or do not feel safe at home. This information is not intended to replace advice given to you by your health care provider. Make sure you discuss any questions you have with your health care provider. Document Released: 03/21/2011 Document Revised: 02/11/2016 Document Reviewed: 06/09/2015 Elsevier Interactive Patient Education  2017 [employer].

## 2016-12-29 NOTE — Progress Notes (Signed)
ANNUAL PREVENTATIVE CARE GYN  ENCOUNTER NOTE  Subjective:       Daisy Foley is a 36 y.o. [phone] female here for a routine annual gynecologic exam.  Current complaints: 1.  Wants std testing  2. Vaginal bleeding and spotting x 6 months  Patient was diagnosed with endometriosis by Dr. Rayford Halsted in 2011 The patient underwent laparoscopic supracervical hysterectomy bilateral salpingectomy in 2015 by Dr. Cristino Martes; pathology demonstrated severe adenomyosis and leiomyoma uteri. Since her laparoscopic surgery she noted occasional pink spotting; however, within the last month she noted 4 days of heavy bleeding requiring protection, associated with central pelvic cramping and right greater than left pelvic cramping. She is sexually active and has not noticed any deep thrusting dyspareunia.  Bowel function is normal Bladder function is normal.   Gynecologic History No LMP recorded. Patient has had a hysterectomy. Central bilateral salpingectomy Contraception: status post hysterectomy-  Last Pap: 2015. Results were: normal Last mammogram: n/a. Results were: '. Obstetric History OB History  Gravida Para Term Preterm AB Living  3 2 2   1 2   SAB TAB Ectopic Multiple Live Births    1     2    # Outcome Date GA Lbr Len/2nd Weight Sex Delivery Anes PTL Lv  3 Term 2007   7 lb 1.6 oz (3.221 kg) M Vag-Spont   LIV  2 TAB 2001          1 Term 2000   6 lb 2.1 oz (2.781 kg) M Vag-Spont   LIV      Past Medical History:  Diagnosis Date  . Endometriosis/adenomyosis   . Fibroid   . Genital herpes     Past Surgical History:  Procedure Laterality Date  . DILATION AND CURETTAGE OF UTERUS    . ENDOMETRIAL ABLATION    . Laparoscopic supracervical hysterectomy bilateral salpingectomy  2015   and BS- with [facility] - [facility]  . PELVIC LAPAROSCOPY      Current Outpatient Prescriptions on File Prior to Visit  Medication Sig Dispense Refill  . acyclovir (ZOVIRAX) 400 MG tablet Take 1 tablet (400 mg total) by  mouth 2 (two) times daily. 60 tablet 5   No current facility-administered medications on file prior to visit.     No Known Allergies  Social History   Social History  . Marital status: Divorced    Spouse name: N/A  . Number of children: N/A  . Years of education: N/A   Occupational History  . Not on file.   Social History Main Topics  . Smoking status: Never Smoker  . Smokeless tobacco: Never Used  . Alcohol use 0.0 oz/week     Comment: occas  . Drug use: No     Comment: marijuana use twice this past weekend  . Sexual activity: Yes    Birth control/ protection: Surgical   Other Topics Concern  . Not on file   Social History Narrative  . No narrative on file    Family History  Problem Relation Age of Onset  . Hypertension Mother   . Depression Mother   . Bipolar disorder Mother   . Diabetes Mother   . Diabetes Maternal Aunt   . Diabetes Maternal Uncle   . Breast cancer Neg Hx   . Ovarian cancer Neg Hx   . Colon cancer Neg Hx   . Heart disease Neg Hx     The following portions of the patient's history were reviewed and updated as appropriate: allergies, current  medications, past family history, past medical history, past social history, past surgical history and problem list.  Review of Systems Review of Systems  Constitutional: Negative.   Respiratory: Negative.   Cardiovascular: Negative.   Gastrointestinal: Negative.   Genitourinary: Negative.        Vaginal bleeding  Musculoskeletal: Negative.   Skin: Negative.   Neurological: Negative.   Endo/Heme/Allergies: Negative.   Psychiatric/Behavioral: Negative.     Objective:   BP 128/81   Pulse 94   Ht 6' (1.829 m)   Wt 225 lb 6.4 oz (102.2 kg)   BMI 30.57 kg/m  CONSTITUTIONAL: Well-developed, well-nourished female in no acute distress.  PSYCHIATRIC: Normal mood and affect. Normal behavior. Normal judgment and thought content. [place]: Alert and oriented to person, place, and time. Normal  muscle tone coordination. No cranial nerve deficit noted. HENT:  Normocephalic, atraumatic, External right and left ear normal. Oropharynx is clear and moist EYES: Conjunctivae and EOM are normal. Pupils are equal, round, and reactive to light. No scleral icterus.  NECK: Normal range of motion, supple, no masses.  Normal thyroid.  SKIN: Skin is warm and dry. No rash noted. Not diaphoretic. No erythema. No pallor. CARDIOVASCULAR: Normal heart rate noted, regular rhythm, no murmur. RESPIRATORY: Clear to auscultation bilaterally. Effort and breath sounds normal, no problems with respiration noted. BREASTS: Symmetric in size. No masses, skin changes, nipple drainage; right axillary lymph node 1 x 2 cm, nontender, mobile ABDOMEN: Soft, normal bowel sounds, no distention noted.  No tenderness, rebound or guarding.  BLADDER: Normal PELVIC:  External Genitalia: Normal  BUS: Normal  Vagina: Normal  Cervix: Normal; no lesions; no significant discharge  Uterus: Surgically absent  Adnexa: Normal; nonpalpable and nontender  RV: External Exam NormaI  MUSCULOSKELETAL: Normal range of motion. No tenderness.  No cyanosis, clubbing, or edema.  2+ distal pulses. LYMPHATIC: No Axillary, Supraclavicular, or Inguinal Adenopathy.    Assessment:   Annual gynecologic examination 36 y.o. Contraception: status post hysterectomy status post laparoscopic supracervical hysterectomy with bilateral salpingectomy bmi-30 History of endometriosis/adenomyosis Vaginal bleeding Right axillary lymph node 1 x 2 cm, slightly tender, unclear etiology  etiology for vaginal bleeding may be cervicitis, dysplasia, Chlamydia/gonorrhea, possibly endometriosis/adenomyosis which is residual following her hysterectomy. She'll be treated with doxycycline for 14 days to see if we could decrease the significance of her bleeding.  Plan:  Pap: Pap Co Test and GC/CT NAAT Mammogram: Not Indicated Stool Guaiac Testing:  Not  Indicated Labs: std panel Routine preventative health maintenance measures emphasized: Exercise/Diet/Weight control, Tobacco Warnings, Alcohol/Substance use risks and Safe Sex Doxycycline 100 mg twice a day for 14 days Maintain menstrual calendar monitoring for abnormal uterine bleeding assessment Return in 3 months for follow-up on abnormal uterine bleeding and isolated right axillary adenopathy Management of abnormal uterine bleeding status post [facility] reviewed including medical therapy versus surgical interventions Return to Clinic - 1 Year for annual exam   Brayton Mars, MD   Note: This dictation was prepared with Dragon dictation along with smaller phrase technology. Any transcriptional errors that result from this process are unintentional.

## 2016-12-30 LAB — RPR: RPR Ser Ql: NONREACTIVE

## 2016-12-30 LAB — HIV ANTIBODY (ROUTINE TESTING W REFLEX): HIV SCREEN 4TH GENERATION: NONREACTIVE

## 2016-12-30 LAB — HEPATITIS B SURFACE ANTIGEN: Hepatitis B Surface Ag: NEGATIVE

## 2016-12-30 LAB — HEPATITIS C ANTIBODY: Hep C Virus Ab: <0.1 s/co ratio (ref 0.0–0.9)

## 2017-01-02 ENCOUNTER — Telehealth: Payer: Self-pay

## 2017-01-02 MED ORDER — FLUCONAZOLE 150 MG PO TABS: 150.0000 mg | ORAL_TABLET | ORAL | 0 refills | Status: DC

## 2017-01-02 NOTE — Telephone Encounter (Signed)
Pt was given doxycyline at last visit. She took it for several days and developed a y/I. She is having itching, burning, and a d/c. Will give diflucan 1 now and 1 in 3 days if sx persists.

## 2017-01-02 NOTE — Telephone Encounter (Signed)
Pt calls and states that she was here last week and was placed on an antibiotic pt states that now she has a very bad yeast infection and would like a diflucan sent in if possible. [facility] in [place].

## 2017-01-03 ENCOUNTER — Telehealth: Payer: Self-pay | Admitting: Obstetrics and Gynecology

## 2017-01-03 LAB — PAP IG, CT-NG NAA, HPV HIGH-RISK
CHLAMYDIA, NUC. ACID AMP: NEGATIVE
Gonococcus by Nucleic Acid Amp: NEGATIVE
HPV, HIGH-RISK: NEGATIVE
PAP SMEAR COMMENT: 0

## 2017-01-03 NOTE — Telephone Encounter (Signed)
ERROR

## 2017-01-09 DIAGNOSIS — N939 Abnormal uterine and vaginal bleeding, unspecified: Secondary | ICD-10-CM | POA: Insufficient documentation

## 2017-01-09 DIAGNOSIS — Z202 Contact with and (suspected) exposure to infections with a predominantly sexual mode of transmission: Secondary | ICD-10-CM | POA: Insufficient documentation

## 2017-03-25 DIAGNOSIS — W57XXXA Bitten or stung by nonvenomous insect and other nonvenomous arthropods, initial encounter: Secondary | ICD-10-CM | POA: Diagnosis not present

## 2017-03-25 DIAGNOSIS — S0086XA Insect bite (nonvenomous) of other part of head, initial encounter: Secondary | ICD-10-CM | POA: Diagnosis not present

## 2017-03-30 ENCOUNTER — Encounter: Payer: 59 | Admitting: Obstetrics and Gynecology

## 2017-05-17 ENCOUNTER — Ambulatory Visit (INDEPENDENT_AMBULATORY_CARE_PROVIDER_SITE_OTHER): Payer: 59 | Admitting: Family Medicine

## 2017-05-17 ENCOUNTER — Encounter: Payer: Self-pay | Admitting: Family Medicine

## 2017-05-17 VITALS — BP 116/68 | HR 95 | Temp 98.3°F | Resp 16 | Wt 228.0 lb

## 2017-05-17 DIAGNOSIS — J029 Acute pharyngitis, unspecified: Secondary | ICD-10-CM

## 2017-05-17 DIAGNOSIS — R5383 Other fatigue: Secondary | ICD-10-CM | POA: Diagnosis not present

## 2017-05-17 DIAGNOSIS — B2790 Infectious mononucleosis, unspecified without complication: Secondary | ICD-10-CM | POA: Diagnosis not present

## 2017-05-17 DIAGNOSIS — R591 Generalized enlarged lymph nodes: Secondary | ICD-10-CM | POA: Diagnosis not present

## 2017-05-17 LAB — POCT RAPID STREP A (OFFICE): Rapid Strep A Screen: NEGATIVE

## 2017-05-17 NOTE — Progress Notes (Signed)
Patient: Daisy Foley Female    DOB: 06-Feb-1981   36 y.o.   MRN: [id number] Visit Date: 05/17/2017  Today's Provider: [name], MD   Chief Complaint  Patient presents with  . Sore Throat   Subjective:    HPI Pt is here today for sore throat, nausea, headache, and chills. She reports that for weeks she has had what she thought was allergy symptoms, then about 3 days ago she started having a severe sore throat and pain in her ears when she swallows. She also reports that she has lymph nodes that are swollen under her chin. No fevers, cough or sinus congestion.     No Known Allergies   Current Outpatient Prescriptions:  .  acyclovir (ZOVIRAX) 400 MG tablet, Take 1 tablet (400 mg total) by mouth 2 (two) times daily., Disp: 60 tablet, Rfl: 5 .  doxycycline (VIBRAMYCIN) 100 MG capsule, Take 1 capsule (100 mg total) by mouth 2 (two) times daily. (Patient not taking: Reported on 05/17/2017), Disp: 28 capsule, Rfl: 0 .  fluconazole (DIFLUCAN) 150 MG tablet, Take 1 tablet (150 mg total) by mouth once a week. If sx persist, can repeat in 3 days (Patient not taking: Reported on 05/17/2017), Disp: 2 tablet, Rfl: 0  Review of Systems  Constitutional: Positive for chills and fatigue.  HENT: Positive for ear pain and sore throat.   Eyes: Negative.   Respiratory: Negative.   Cardiovascular: Negative.   Gastrointestinal: Positive for nausea.  Endocrine: Negative.   Genitourinary: Negative.   Musculoskeletal: Positive for back pain.  Allergic/Immunologic: Negative.   Neurological: Negative.   Hematological: Negative.     Social History  Substance Use Topics  . Smoking status: Never Smoker  . Smokeless tobacco: Never Used  . Alcohol use 0.0 oz/week     Comment: occas   Objective:   BP 116/68 (BP Location: Left Arm, Patient Position: Sitting, Cuff Size: Large)   Pulse 95   Temp 98.3 F (36.8 C) (Oral)   Resp 16   Wt 228 lb (103.4 kg)   SpO2 98%   BMI 30.92 kg/m    Vitals:   05/17/17 1431  BP: 116/68  Pulse: 95  Resp: 16  Temp: 98.3 F (36.8 C)  TempSrc: Oral  SpO2: 98%  Weight: 228 lb (103.4 kg)     Physical Exam  Constitutional: She is oriented to person, place, and time. She appears well-developed and well-nourished.  HENT:  Head: Normocephalic and atraumatic.  Right Ear: External ear normal.  Left Ear: External ear normal.  Nose: Nose normal.  Mouth/Throat: Oropharynx is clear and moist.  Eyes: Pupils are equal, round, and reactive to light. Conjunctivae and EOM are normal.  Neck: Normal range of motion. Neck supple.  auricular posterior cervical lymph nodes.   Cardiovascular: Normal rate, regular rhythm, normal heart sounds and intact distal pulses.   Pulmonary/Chest: Effort normal and breath sounds normal.  Musculoskeletal: Normal range of motion.  Lymphadenopathy:    She has cervical adenopathy.  Neurological: She is alert and oriented to person, place, and time. She has normal reflexes.  Skin: Skin is warm and dry.  Psychiatric: She has a normal mood and affect. Her behavior is normal. Judgment and thought content normal.        Assessment & Plan:         1. Sore throat  - POCT rapid strep A - Mononucleosis screen - CBC with Differential/Platelet  2. Other fatigue  -  Mononucleosis screen - CBC with Differential/Platelet  3. Mononucleosis Out of work for the rest of the week.    4. Lymphadenopathy   HPI, Exam, and A&P Transcribed under the direction and in the presence of Hykeem Ojeda L. Cranford Mon, MD  Electronically Signed: Katina Dung, CMA   I have done the exam and reviewed the above chart and it is accurate to the best of my knowledge. Development worker, community has been used in this note in any air is in the dictation or transcription are unintentional.  [name], MD  [place]

## 2017-05-18 LAB — CBC WITH DIFFERENTIAL/PLATELET
Basophils Absolute: 0 10*3/uL (ref 0.0–0.2)
Basos: 0 %
EOS (ABSOLUTE): 0.1 10*3/uL (ref 0.0–0.4)
EOS: 1 %
HEMATOCRIT: 39.6 % (ref 34.0–46.6)
Hemoglobin: 13 g/dL (ref 11.1–15.9)
IMMATURE GRANULOCYTES: 0 %
Immature Grans (Abs): 0 10*3/uL (ref 0.0–0.1)
Lymphocytes Absolute: 1.3 10*3/uL (ref 0.7–3.1)
Lymphs: 16 %
MCH: 29.1 pg (ref 26.6–33.0)
MCHC: 32.8 g/dL (ref 31.5–35.7)
MCV: 89 fL (ref 79–97)
MONOS ABS: 0.8 10*3/uL (ref 0.1–0.9)
Monocytes: 9 %
NEUTROS PCT: 74 %
Neutrophils Absolute: 6.3 10*3/uL (ref 1.4–7.0)
PLATELETS: 228 10*3/uL (ref 150–379)
RBC: 4.46 x10E6/uL (ref 3.77–5.28)
RDW: 14 % (ref 12.3–15.4)
WBC: 8.6 10*3/uL (ref 3.4–10.8)

## 2017-05-18 LAB — MONONUCLEOSIS SCREEN: Mono Screen: NEGATIVE

## 2017-05-23 ENCOUNTER — Telehealth: Payer: Self-pay | Admitting: Family Medicine

## 2017-05-23 NOTE — Telephone Encounter (Signed)
Pt is requesting a nurse to return her call to get her lab results from 05/17/17 and pt stated she isn't feeling better. Pt stated she wasn't sure if she needs another OV. Pt stated that she was supposed to return to work today but she didn't because she didn't feel well enough to return to work. Please advise. Thanks TNP

## 2017-05-23 NOTE — Telephone Encounter (Signed)
Patient was advised of labs results-Labs from 08/29.She reports that she is still having a lot of pressure in her head. A lot of pressure on her left ear.Feels very congested. She reports that she has taken OTC medications. Like Mucinex, cough drops, decongestant medications. Please review and advise.  Thanks,  -Daisy Foley

## 2017-05-24 ENCOUNTER — Ambulatory Visit (INDEPENDENT_AMBULATORY_CARE_PROVIDER_SITE_OTHER): Payer: 59 | Admitting: Family Medicine

## 2017-05-24 VITALS — BP 118/78 | HR 88 | Temp 97.8°F | Resp 18 | Wt 230.2 lb

## 2017-05-24 DIAGNOSIS — H65192 Other acute nonsuppurative otitis media, left ear: Secondary | ICD-10-CM | POA: Diagnosis not present

## 2017-05-24 MED ORDER — FLUCONAZOLE 150 MG PO TABS: ORAL_TABLET | ORAL | 0 refills | Status: DC

## 2017-05-24 MED ORDER — AZITHROMYCIN 250 MG PO TABS: ORAL_TABLET | ORAL | 0 refills | Status: DC

## 2017-05-24 NOTE — Progress Notes (Signed)
Daisy Foley  MRN: [id number] DOB: [birth year]  Subjective:  HPI   Patient is here to follow up, She is still not feeling well. Last office visit was on 05/17/17 for sore throat. Strep was done and it was negative. Also checked CBC and Mono which were normal. Patient was told to rest and push fluids. No medications were started. Patient states she is not getting any better. She has tried to rest push fluids, has used OTC Mucinnex, nasal sprays and cough drops. She has cried due to feeling so bad the past 2 days and not able to sleep well. Symptoms are irritated sore throat, ear pain and ear pressure, headache, dizziness, head congestion, nasal congestion, chills, hoarse, shortness of breath. No body aches, has not checked her temperature, no cough.   Patient Active Problem List   Diagnosis Date Noted  . Abnormal vaginal bleeding 01/09/2017  . STD exposure 01/09/2017  . Palpable lymph node 12/29/2016  . ADD (attention deficit disorder) 06/29/2015  . Clinical depression 06/29/2015  . Esophagitis, reflux 06/29/2015  . Adult hypothyroidism 06/29/2015  . Headache, migraine 06/29/2015  . Pure hypercholesterolemia 06/29/2015  . Allergic rhinitis 07/16/2009  . Absolute anemia 07/16/2009  . Essential (primary) hypertension 07/16/2009  . Genital herpes 02/10/2009    Past Medical History:  Diagnosis Date  . Endometriosis/adenomyosis   . Fibroid   . Genital herpes     Social History   Social History  . Marital status: Divorced    Spouse name: N/A  . Number of children: N/A  . Years of education: N/A   Occupational History  . Not on file.   Social History Main Topics  . Smoking status: Never Smoker  . Smokeless tobacco: Never Used  . Alcohol use 0.0 oz/week     Comment: occas  . Drug use: No     Comment: marijuana use twice this past weekend  . Sexual activity: Yes    Birth control/ protection: Surgical   Other Topics Concern  . Not on file   Social History  Narrative  . No narrative on file    Outpatient Encounter Prescriptions as of 05/24/2017  Medication Sig  . acyclovir (ZOVIRAX) 400 MG tablet Take 1 tablet (400 mg total) by mouth 2 (two) times daily.  . [DISCONTINUED] doxycycline (VIBRAMYCIN) 100 MG capsule Take 1 capsule (100 mg total) by mouth 2 (two) times daily. (Patient not taking: Reported on 05/17/2017)  . [DISCONTINUED] fluconazole (DIFLUCAN) 150 MG tablet Take 1 tablet (150 mg total) by mouth once a week. If sx persist, can repeat in 3 days (Patient not taking: Reported on 05/17/2017)   No facility-administered encounter medications on file as of 05/24/2017.     No Known Allergies  Review of Systems  Constitutional: Positive for chills and malaise/fatigue.  HENT: Positive for congestion, ear pain and sore throat.        Ear pressure, nasal congestion.  Eyes: Negative.   Respiratory: Positive for shortness of breath. Negative for cough and sputum production.   Cardiovascular: Negative.   Musculoskeletal: Negative.   Neurological: Positive for dizziness, weakness and headaches.  Psychiatric/Behavioral: Negative.     Objective:  BP 118/78   Pulse 88   Temp 97.8 F (36.6 C)   Resp 18   Wt 230 lb 3.2 oz (104.4 kg)   SpO2 96%   BMI 31.22 kg/m   Physical Exam  Constitutional: She is oriented to person, place, and time and well-developed, well-nourished, and in no distress.  HENT:  Head: Normocephalic and atraumatic.  Right Ear: External ear normal.  Left Ear: External ear normal.  Nose: Nose normal.  Mouth/Throat: Oropharynx is clear and moist.  Eyes: Conjunctivae are normal.  Neck: Neck supple. No thyromegaly present.  Cardiovascular: Normal rate, regular rhythm and normal heart sounds.   Pulmonary/Chest: Effort normal and breath sounds normal.  Abdominal: Soft.  Neurological: She is alert and oriented to person, place, and time.  Skin: Skin is warm and dry.  Psychiatric: Mood, memory, affect and judgment normal.     Assessment and Plan :  1. Other acute nonsuppurative otitis media of left ear, recurrence not specified Try to irrigate left ear but due to pain could not finish. Will treat with Zpak. Follow as needed. - azithromycin (ZITHROMAX) 250 MG tablet; As directed  Dispense: 6 tablet; Refill: 0 - fluconazole (DIFLUCAN) 150 MG tablet; Take 1 tablet once daily for 2 days  Dispense: 2 tablet; Refill: 0  HPI, Exam and A&P transcribed by Tiffany Kocher, RMA under direction and in the presence of Miguel Aschoff, MD. I have done the exam and reviewed the chart and it is accurate to the best of my knowledge. Development worker, community has been used and  any errors in dictation or transcription are unintentional. Miguel Aschoff M.D. [facility]

## 2017-05-24 NOTE — Telephone Encounter (Signed)
Patient made an appointment for [place], RMA

## 2017-07-17 DIAGNOSIS — Z23 Encounter for immunization: Secondary | ICD-10-CM | POA: Diagnosis not present

## 2017-11-25 ENCOUNTER — Other Ambulatory Visit: Payer: Self-pay | Admitting: Family Medicine

## 2018-01-08 NOTE — Progress Notes (Signed)
ANNUAL PREVENTATIVE CARE GYN  ENCOUNTER NOTE  Subjective:       Daisy Foley is a 37 y.o. G82P2012 female here for a routine annual gynecologic exam.  Current complaints:  1. Vaginal spotting occasionally; frequency is approximately every couple months for for 5 days and is associated with some pelvic pain. No history of abnormal Pap smears.  No history of unusual vaginal discharge or odor  Patient was diagnosed with endometriosis by Dr. Rayford Halsted in 2011 The patient underwent laparoscopic supracervical hysterectomy bilateral salpingectomy in 2015 by Dr. Cristino Martes; pathology demonstrated severe adenomyosis and leiomyoma uteri.  Bowel function is normal Bladder function is normal.   Gynecologic History No LMP recorded. Patient has had a hysterectomy. [place] bilateral salpingectomy Contraception: status post hysterectomy-  Last Pap: 2018 neg/neg/neg/neg. Results were: normal Last mammogram: n/a. Results were: '. Obstetric History OB History  Gravida Para Term Preterm AB Living  3 2 2   1 2   SAB TAB Ectopic Multiple Live Births    1     2    # Outcome Date GA Lbr Len/2nd Weight Sex Delivery Anes PTL Lv  3 Term 2007   7 lb 1.6 oz (3.221 kg) M Vag-Spont   LIV  2 TAB 2001          1 Term 2000   6 lb 2.1 oz (2.781 kg) M Vag-Spont   LIV    Past Medical History:  Diagnosis Date  . Endometriosis/adenomyosis   . Fibroid   . Genital herpes     Past Surgical History:  Procedure Laterality Date  . DILATION AND CURETTAGE OF UTERUS    . ENDOMETRIAL ABLATION    . Laparoscopic supracervical hysterectomy bilateral salpingectomy  2015   and BS- with [facility] - [facility]  . PELVIC LAPAROSCOPY      Current Outpatient Medications on File Prior to Visit  Medication Sig Dispense Refill  . acyclovir (ZOVIRAX) 400 MG tablet TAKE 1 TABLET(400 MG) BY MOUTH TWICE DAILY 60 tablet 11  . azithromycin (ZITHROMAX) 250 MG tablet As directed 6 tablet 0  . fluconazole (DIFLUCAN) 150 MG tablet Take 1 tablet once  daily for 2 days 2 tablet 0   No current facility-administered medications on file prior to visit.     No Known Allergies  Social History   Socioeconomic History  . Marital status: Divorced    Spouse name: Not on file  . Number of children: Not on file  . Years of education: Not on file  . Highest education level: Not on file  Occupational History  . Not on file  Social Needs  . Financial resource strain: Not on file  . Food insecurity:    Worry: Not on file    Inability: Not on file  . Transportation needs:    Medical: Not on file    Non-medical: Not on file  Tobacco Use  . Smoking status: Never Smoker  . Smokeless tobacco: Never Used  Substance and Sexual Activity  . Alcohol use: Yes    Alcohol/week: 0.0 oz    Comment: occas  . Drug use: No    Comment: marijuana use twice this past weekend  . Sexual activity: Yes    Birth control/protection: Surgical  Lifestyle  . Physical activity:    Days per week: Not on file    Minutes per session: Not on file  . Stress: Not on file  Relationships  . Social connections:    Talks on phone: Not on file  Gets together: Not on file    Attends religious service: Not on file    Active member of club or organization: Not on file    Attends meetings of clubs or organizations: Not on file    Relationship status: Not on file  . Intimate partner violence:    Fear of current or ex partner: Not on file    Emotionally abused: Not on file    Physically abused: Not on file    Forced sexual activity: Not on file  Other Topics Concern  . Not on file  Social History Narrative  . Not on file    Family History  Problem Relation Age of Onset  . Hypertension Mother   . Depression Mother   . Bipolar disorder Mother   . Diabetes Mother   . Diabetes Maternal Aunt   . Diabetes Maternal Uncle   . Breast cancer Neg Hx   . Ovarian cancer Neg Hx   . Colon cancer Neg Hx   . Heart disease Neg Hx     The following portions of the  patient's history were reviewed and updated as appropriate: allergies, current medications, past family history, past medical history, past social history, past surgical history and problem list.  Review of Systems Review of Systems  Constitutional: Negative.   HENT: Negative.   Eyes: Negative.   Respiratory: Negative.   Cardiovascular: Negative.   Gastrointestinal: Negative.   Genitourinary:       Vaginal spotting every couple months, associated with pelvic pain at times. Breast tenderness often occurs right around the time of spotting.  Musculoskeletal: Negative.   Skin: Negative.   Neurological: Negative.   Endo/Heme/Allergies: Negative.      Objective:   BP 122/78   Pulse 87   Ht 6' (1.829 m)   Wt 231 lb 12.8 oz (105.1 kg)   BMI 31.44 kg/m  CONSTITUTIONAL: Well-developed, well-nourished female in no acute distress.  PSYCHIATRIC: Normal mood and affect. Normal behavior. Normal judgment and thought content. [place]: Alert and oriented to person, place, and time. Normal muscle tone coordination. No cranial nerve deficit noted. HENT:  Normocephalic, atraumatic, External right and left ear normal.  EYES: Conjunctivae and EOM are normal. No scleral icterus.  NECK: Normal range of motion, supple, no masses.  Normal thyroid.  SKIN: Skin is warm and dry. No rash noted. Not diaphoretic. No erythema. No pallor. CARDIOVASCULAR: Normal heart rate noted, regular rhythm, no murmur. RESPIRATORY: Clear to auscultation bilaterally. Effort and breath sounds normal, no problems with respiration noted. BREASTS: Symmetric in size. No masses, skin changes, nipple drainage ABDOMEN: Soft, normal bowel sounds, no distention noted.  No tenderness, rebound or guarding.  BLADDER: Normal PELVIC:  External Genitalia: Normal  BUS: Normal  Vagina: Normal  Cervix: Normal; no lesions; no significant discharge; healthy cervical mucus noted; no cervical motion tenderness  Uterus: Surgically  absent  Adnexa: Normal; nonpalpable and nontender  RV: External Exam NormaI  MUSCULOSKELETAL: Normal range of motion. No tenderness.  No cyanosis, clubbing, or edema.  2+ distal pulses. LYMPHATIC: No Axillary, Supraclavicular, or Inguinal Adenopathy.    Assessment:   Annual gynecologic examination 37 y.o. Contraception: status post hysterectomy status post laparoscopic supracervical hysterectomy with bilateral salpingectomy for adenomyosis [id number] History of endometriosis/adenomyosis Intermittent vaginal spotting, possibly cyclic. Breast tenderness; normal exam     Plan:  Pap: Due 2021; GC/CT testing is done Mammogram: Not Indicated Stool Guaiac Testing:  Not Indicated Labs:lipid a1c fbs tsh Routine preventative health maintenance measures emphasized: Exercise/Diet/Weight  control, Tobacco Warnings, Alcohol/Substance use risks and Safe Sex  Menstrual calendar monitoring for abnormal uterine bleeding Decrease caffeine intake Return to [place] for annual exam   Joyice Faster, CMA  Brayton Mars, MD   Note: This dictation was prepared with Dragon dictation along with smaller phrase technology. Any transcriptional errors that result from this process are unintentional.

## 2018-01-11 ENCOUNTER — Ambulatory Visit (INDEPENDENT_AMBULATORY_CARE_PROVIDER_SITE_OTHER): Payer: 59 | Admitting: Obstetrics and Gynecology

## 2018-01-11 ENCOUNTER — Encounter: Payer: Self-pay | Admitting: Obstetrics and Gynecology

## 2018-01-11 VITALS — BP 122/78 | HR 87 | Ht 72.0 in | Wt 231.8 lb

## 2018-01-11 DIAGNOSIS — Z01419 Encounter for gynecological examination (general) (routine) without abnormal findings: Secondary | ICD-10-CM

## 2018-01-11 DIAGNOSIS — Z202 Contact with and (suspected) exposure to infections with a predominantly sexual mode of transmission: Secondary | ICD-10-CM

## 2018-01-11 MED ORDER — ACYCLOVIR 400 MG PO TABS: 800.0000 mg | ORAL_TABLET | Freq: Two times a day (BID) | ORAL | 6 refills | Status: AC

## 2018-01-11 NOTE — Patient Instructions (Addendum)
1.  Pap smear is not done.  Next Pap smear is due in 2021.  GC/CT testing is performed 2.  Self breast awareness is encouraged 3.  Screening labs are to be obtained through primary care 4.  Continue with healthy eating, exercise, and control weight loss 5.  Return in 1 year for annual exam 6.  Maintain menstrual calendar monitoring for any abnormal uterine bleeding 7.  Decreased caffeine intake in order to help with breast tenderness  Health Maintenance, Female Adopting a healthy lifestyle and getting preventive care can go a long way to promote health and wellness. Talk with your health care provider about what schedule of regular examinations is right for you. This is a good chance for you to check in with your provider about disease prevention and staying healthy. In between checkups, there are plenty of things you can do on your own. Experts have done a lot of research about which lifestyle changes and preventive measures are most likely to keep you healthy. Ask your health care provider for more information. Weight and diet Eat a healthy diet  Be sure to include plenty of vegetables, fruits, low-fat dairy products, and lean protein.  Do not eat a lot of foods high in solid fats, added sugars, or salt.  Get regular exercise. This is one of the most important things you can do for your health. ? Most adults should exercise for at least 150 minutes each week. The exercise should increase your heart rate and make you sweat (moderate-intensity exercise). ? Most adults should also do strengthening exercises at least twice a week. This is in addition to the moderate-intensity exercise.  Maintain a healthy weight  Body mass index (BMI) is a measurement that can be used to identify possible weight problems. It estimates body fat based on height and weight. Your health care provider can help determine your BMI and help you achieve or maintain a healthy weight.  For females 42 years of age and  older: ? A BMI below 18.5 is considered underweight. ? A BMI of 18.5 to 24.9 is normal. ? A BMI of 25 to 29.9 is considered overweight. ? A BMI of 30 and above is considered obese.  Watch levels of cholesterol and blood lipids  You should start having your blood tested for lipids and cholesterol at 35 years of age, then have this test every 5 years.  You may need to have your cholesterol levels checked more often if: ? Your lipid or cholesterol levels are high. ? You are older than 35 years of age. ? You are at high risk for heart disease.  Cancer screening Lung Cancer  Lung cancer screening is recommended for adults 29-53 years old who are at high risk for lung cancer because of a history of smoking.  A yearly low-dose CT scan of the lungs is recommended for people who: ? Currently smoke. ? Have quit within the past 15 years. ? Have at least a 30-pack-year history of smoking. A pack year is smoking an average of one pack of cigarettes a day for 1 year.  Yearly screening should continue until it has been 15 years since you quit.  Yearly screening should stop if you develop a health problem that would prevent you from having lung cancer treatment.  Breast Cancer  Practice breast self-awareness. This means understanding how your breasts normally appear and feel.  It also means doing regular breast self-exams. Let your health care provider know about any changes, no  matter how small.  If you are in your 20s or 30s, you should have a clinical breast exam (CBE) by a health care provider every 1-3 years as part of a regular health exam.  If you are 46 or older, have a CBE every year. Also consider having a breast X-ray (mammogram) every year.  If you have a family history of breast cancer, talk to your health care provider about genetic screening.  If you are at high risk for breast cancer, talk to your health care provider about having an MRI and a mammogram every year.  Breast  cancer gene (BRCA) assessment is recommended for women who have family members with BRCA-related cancers. BRCA-related cancers include: ? Breast. ? Ovarian. ? Tubal. ? Peritoneal cancers.  Results of the assessment will determine the need for genetic counseling and BRCA1 and BRCA2 testing.  Cervical Cancer Your health care provider may recommend that you be screened regularly for cancer of the pelvic organs (ovaries, uterus, and vagina). This screening involves a pelvic examination, including checking for microscopic changes to the surface of your cervix (Pap test). You may be encouraged to have this screening done every 3 years, beginning at age 38.  For women ages 36-65, health care providers may recommend pelvic exams and Pap testing every 3 years, or they may recommend the Pap and pelvic exam, combined with testing for human papilloma virus (HPV), every 5 years. Some types of HPV increase your risk of cervical cancer. Testing for HPV may also be done on women of any age with unclear Pap test results.  Other health care providers may not recommend any screening for nonpregnant women who are considered low risk for pelvic cancer and who do not have symptoms. Ask your health care provider if a screening pelvic exam is right for you.  If you have had past treatment for cervical cancer or a condition that could lead to cancer, you need Pap tests and screening for cancer for at least 20 years after your treatment. If Pap tests have been discontinued, your risk factors (such as having a new sexual partner) need to be reassessed to determine if screening should resume. Some women have medical problems that increase the chance of getting cervical cancer. In these cases, your health care provider may recommend more frequent screening and Pap tests.  Colorectal Cancer  This type of cancer can be detected and often prevented.  Routine colorectal cancer screening usually begins at 35 years of age and  continues through 35 years of age.  Your health care provider may recommend screening at an earlier age if you have risk factors for colon cancer.  Your health care provider may also recommend using home test kits to check for hidden blood in the stool.  A small camera at the end of a tube can be used to examine your colon directly (sigmoidoscopy or colonoscopy). This is done to check for the earliest forms of colorectal cancer.  Routine screening usually begins at age 77.  Direct examination of the colon should be repeated every 5-10 years through 35 years of age. However, you may need to be screened more often if early forms of precancerous polyps or small growths are found.  Skin Cancer  Check your skin from head to toe regularly.  Tell your health care provider about any new moles or changes in moles, especially if there is a change in a mole's shape or color.  Also tell your health care provider if you have  a mole that is larger than the size of a pencil eraser.  Always use sunscreen. Apply sunscreen liberally and repeatedly throughout the day.  Protect yourself by wearing long sleeves, pants, a wide-brimmed hat, and sunglasses whenever you are outside.  Heart disease, diabetes, and high blood pressure  High blood pressure causes heart disease and increases the risk of stroke. High blood pressure is more likely to develop in: ? People who have blood pressure in the high end of the normal range (130-139/85-89 mm Hg). ? People who are overweight or obese. ? People who are African American.  If you are 62-41 years of age, have your blood pressure checked every 3-5 years. If you are 59 years of age or older, have your blood pressure checked every year. You should have your blood pressure measured twice-once when you are at a hospital or clinic, and once when you are not at a hospital or clinic. Record the average of the two measurements. To check your blood pressure when you are not  at a hospital or clinic, you can use: ? An automated blood pressure machine at a pharmacy. ? A home blood pressure monitor.  If you are between 65 years and 64 years old, ask your health care provider if you should take aspirin to prevent strokes.  Have regular diabetes screenings. This involves taking a blood sample to check your fasting blood sugar level. ? If you are at a normal weight and have a low risk for diabetes, have this test once every three years after 35 years of age. ? If you are overweight and have a high risk for diabetes, consider being tested at a younger age or more often. Preventing infection Hepatitis B  If you have a higher risk for hepatitis B, you should be screened for this virus. You are considered at high risk for hepatitis B if: ? You were born in a country where hepatitis B is common. Ask your health care provider which countries are considered high risk. ? Your parents were born in a high-risk country, and you have not been immunized against hepatitis B (hepatitis B vaccine). ? You have HIV or AIDS. ? You use needles to inject street drugs. ? You live with someone who has hepatitis B. ? You have had sex with someone who has hepatitis B. ? You get hemodialysis treatment. ? You take certain medicines for conditions, including cancer, organ transplantation, and autoimmune conditions.  Hepatitis C  Blood testing is recommended for: ? Everyone born from [birth year] through 1963. ? Anyone with known risk factors for hepatitis C.  Sexually transmitted infections (STIs)  You should be screened for sexually transmitted infections (STIs) including gonorrhea and chlamydia if: ? You are sexually active and are younger than 35 years of age. ? You are older than 35 years of age and your health care provider tells you that you are at risk for this type of infection. ? Your sexual activity has changed since you were last screened and you are at an increased risk for chlamydia  or gonorrhea. Ask your health care provider if you are at risk.  If you do not have HIV, but are at risk, it may be recommended that you take a prescription medicine daily to prevent HIV infection. This is called pre-exposure prophylaxis (PrEP). You are considered at risk if: ? You are sexually active and do not regularly use condoms or know the HIV status of your partner(s). ? You take drugs by injection. ?  You are sexually active with a partner who has HIV.  Talk with your health care provider about whether you are at high risk of being infected with HIV. If you choose to begin PrEP, you should first be tested for HIV. You should then be tested every 3 months for as long as you are taking PrEP. Pregnancy  If you are premenopausal and you may become pregnant, ask your health care provider about preconception counseling.  If you may become pregnant, take 400 to 800 micrograms (mcg) of folic acid every day.  If you want to prevent pregnancy, talk to your health care provider about birth control (contraception). Osteoporosis and menopause  Osteoporosis is a disease in which the bones lose minerals and strength with aging. This can result in serious bone fractures. Your risk for osteoporosis can be identified using a bone density scan.  If you are 75 years of age or older, or if you are at risk for osteoporosis and fractures, ask your health care provider if you should be screened.  Ask your health care provider whether you should take a calcium or vitamin D supplement to lower your risk for osteoporosis.  Menopause may have certain physical symptoms and risks.  Hormone replacement therapy may reduce some of these symptoms and risks. Talk to your health care provider about whether hormone replacement therapy is right for you. Follow these instructions at home:  Schedule regular health, dental, and eye exams.  Stay current with your immunizations.  Do not use any tobacco products  including cigarettes, chewing tobacco, or electronic cigarettes.  If you are pregnant, do not drink alcohol.  If you are breastfeeding, limit how much and how often you drink alcohol.  Limit alcohol intake to no more than 1 drink per day for nonpregnant women. One drink equals 12 ounces of beer, 5 ounces of wine, or 1 ounces of hard liquor.  Do not use street drugs.  Do not share needles.  Ask your health care provider for help if you need support or information about quitting drugs.  Tell your health care provider if you often feel depressed.  Tell your health care provider if you have ever been abused or do not feel safe at home. This information is not intended to replace advice given to you by your health care provider. Make sure you discuss any questions you have with your health care provider. Document Released: 03/21/2011 Document Revised: 02/11/2016 Document Reviewed: 06/09/2015 Elsevier Interactive Patient Education  [employer].

## 2018-01-14 LAB — GC/CHLAMYDIA PROBE AMP
Chlamydia trachomatis, NAA: NEGATIVE
NEISSERIA GONORRHOEAE BY PCR: NEGATIVE

## 2018-04-17 ENCOUNTER — Ambulatory Visit (INDEPENDENT_AMBULATORY_CARE_PROVIDER_SITE_OTHER): Payer: 59 | Admitting: Family Medicine

## 2018-04-17 ENCOUNTER — Encounter: Payer: Self-pay | Admitting: Family Medicine

## 2018-04-17 VITALS — BP 122/80 | HR 77 | Temp 98.4°F | Resp 16 | Wt 229.6 lb

## 2018-04-17 DIAGNOSIS — H9313 Tinnitus, bilateral: Secondary | ICD-10-CM

## 2018-04-17 DIAGNOSIS — H9203 Otalgia, bilateral: Secondary | ICD-10-CM

## 2018-04-17 MED ORDER — FLUTICASONE PROPIONATE 50 MCG/ACT NA SUSP: 2.0000 | Freq: Every day | NASAL | 0 refills | Status: DC

## 2018-04-17 NOTE — Progress Notes (Signed)
  Subjective:     Patient ID: Daisy Foley, female   DOB: [birth year], 37 y.o.   MRN: [id number] Chief Complaint  Patient presents with  . Otalgia    Patient comes in office today with complaints of bilateral ear pain for over four weeks. Patient describes pain as sharp and states that she has had episodes of dizziness and being off balance. Patient reports that she has had drainage from ear and flaking around inside of ear, headaches have been associated with ear pain.    HPI  Reports bilateral tinnitus as well.States her allergies are not bothering her at this time and denies precedent cold sx. No hx of bruxism but states she chews a lot of gum. Reports recent dental exam. No swimming or head dunking noted. States at times she will have total loss of hearing in her right ear which seems to clear with a popping sensation. Has also had a clear drainage with scaling which she clears with her finger.  Review of Systems     Objective:   Physical Exam  Constitutional: She appears well-developed and well-nourished. No distress.  HENT:  Right Ear: External ear normal.  Left Ear: External ear normal.  Mild bilateral tragal tenderness, TM's intact without erythema or distortion of landmarks.       Assessment:    1. Otalgia of both ears: will cover for eustachian tube dysfunction - Ambulatory referral to ENT - fluticasone (FLONASE) 50 MCG/ACT nasal spray; Place 2 sprays into both nostrils daily.  Dispense: 16 g; Refill: 0  2. Tinnitus of both ears - Ambulatory referral to ENT - fluticasone (FLONASE) 50 MCG/ACT nasal spray; Place 2 sprays into both nostrils daily.  Dispense: 16 g; Refill: 0    Plan:    Further f/u pending ENT referral.

## 2018-04-17 NOTE — Patient Instructions (Signed)
We will call you about the ENT referral. Try the steroid nasal spray.

## 2018-05-10 DIAGNOSIS — H93299 Other abnormal auditory perceptions, unspecified ear: Secondary | ICD-10-CM | POA: Diagnosis not present

## 2018-05-10 DIAGNOSIS — H6123 Impacted cerumen, bilateral: Secondary | ICD-10-CM | POA: Diagnosis not present

## 2018-05-10 DIAGNOSIS — H6060 Unspecified chronic otitis externa, unspecified ear: Secondary | ICD-10-CM | POA: Diagnosis not present

## 2018-05-15 ENCOUNTER — Ambulatory Visit
Admission: RE | Admit: 2018-05-15 | Discharge: 2018-05-15 | Disposition: A | Discharge status: Home / Self Care | Payer: 59 | Source: Ambulatory Visit | Attending: Family Medicine | Admitting: Family Medicine

## 2018-05-15 ENCOUNTER — Ambulatory Visit (INDEPENDENT_AMBULATORY_CARE_PROVIDER_SITE_OTHER): Payer: 59 | Admitting: Family Medicine

## 2018-05-15 VITALS — BP 126/85 | HR 66 | Temp 98.3°F | Resp 16 | Wt 226.0 lb

## 2018-05-15 DIAGNOSIS — M79672 Pain in left foot: Secondary | ICD-10-CM

## 2018-05-15 MED ORDER — NAPROXEN 500 MG PO TABS: 500.0000 mg | ORAL_TABLET | Freq: Two times a day (BID) | ORAL | 0 refills | Status: DC

## 2018-05-15 NOTE — Progress Notes (Signed)
Daisy Foley  MRN: [id number] DOB: 01/26/1981  Subjective:  HPI   The patient is a 37 year old female who presents for evaluation of injured toe.  The patient was working out and dropped a 5 pound weight on her right 4th and 5th toe.  She has bruising pain and limited movement of the toes.    Patient Active Problem List   Diagnosis Date Noted  . Abnormal vaginal bleeding 01/09/2017  . STD exposure 01/09/2017  . ADD (attention deficit disorder) 06/29/2015  . Clinical depression 06/29/2015  . Esophagitis, reflux 06/29/2015  . Adult hypothyroidism 06/29/2015  . Headache, migraine 06/29/2015  . Pure hypercholesterolemia 06/29/2015  . Allergic rhinitis 07/16/2009  . Absolute anemia 07/16/2009  . Essential (primary) hypertension 07/16/2009  . Genital herpes 02/10/2009    Past Medical History:  Diagnosis Date  . Endometriosis/adenomyosis   . Fibroid   . Genital herpes     Social History   Socioeconomic History  . Marital status: Divorced    Spouse name: Not on file  . Number of children: Not on file  . Years of education: Not on file  . Highest education level: Not on file  Occupational History  . Not on file  Social Needs  . Financial resource strain: Not on file  . Food insecurity:    Worry: Not on file    Inability: Not on file  . Transportation needs:    Medical: Not on file    Non-medical: Not on file  Tobacco Use  . Smoking status: Never Smoker  . Smokeless tobacco: Never Used  Substance and Sexual Activity  . Alcohol use: Yes    Alcohol/week: 0.0 standard drinks    Comment: occas  . Drug use: No    Comment: marijuana use twice this past weekend  . Sexual activity: Yes    Birth control/protection: Surgical  Lifestyle  . Physical activity:    Days per week: 3 days    Minutes per session: 60 min  . Stress: Not on file  Relationships  . Social connections:    Talks on phone: Not on file    Gets together: Not on file    Attends religious service:  Not on file    Active member of club or organization: Not on file    Attends meetings of clubs or organizations: Not on file    Relationship status: Not on file  . Intimate partner violence:    Fear of current or ex partner: Not on file    Emotionally abused: Not on file    Physically abused: Not on file    Forced sexual activity: Not on file  Other Topics Concern  . Not on file  Social History Narrative  . Not on file    Outpatient Encounter Medications as of 05/15/2018  Medication Sig Note  . acyclovir (ZOVIRAX) 400 MG tablet Take 400 mg by mouth 2 (two) times daily. 04/17/2018: PRN  . fluticasone (FLONASE) 50 MCG/ACT nasal spray Place 2 sprays into both nostrils daily.   . mometasone (ELOCON) 0.1 % cream Apply 1 application topically daily.    No facility-administered encounter medications on file as of 05/15/2018.     No Known Allergies  Review of Systems  Constitutional: Negative.   Respiratory: Negative.   Cardiovascular: Negative.   Gastrointestinal: Negative.   Musculoskeletal: Positive for joint pain.  Psychiatric/Behavioral: Negative.     Objective:  BP 126/85 (BP Location: Right Arm, Patient Position: Sitting, Cuff Size: Normal)  Pulse 66   Temp 98.3 F (36.8 C) (Oral)   Resp 16   Wt 226 lb (102.5 kg)   BMI 30.65 kg/m   Physical Exam  Constitutional: She is oriented to person, place, and time and well-developed, well-nourished, and in no distress.  Cardiovascular: Normal rate.  Pulmonary/Chest: Effort normal.  Musculoskeletal: She exhibits edema and tenderness.  Swollen and tender over dorsum of left foot. Plantar surface ok. Neurovascular intact.  Neurological: She is alert and oriented to person, place, and time. Gait normal. GCS score is 15.  Skin: Skin is warm and dry.  Psychiatric: Mood, memory, affect and judgment normal.    Assessment and Plan :  1. Foot pain, left Out of work reast of week. - DG Foot Complete Left; Future - Ambulatory  referral to Podiatry - naproxen (NAPROSYN) 500 MG tablet; Take 1 tablet (500 mg total) by mouth 2 (two) times daily with a meal.  Dispense: 60 tablet; Refill: 0  I have done the exam and reviewed the chart and it is accurate to the best of my knowledge. Development worker, community has been used and  any errors in dictation or transcription are unintentional. Miguel Aschoff M.D. [facility]

## 2018-05-17 ENCOUNTER — Telehealth: Payer: Self-pay | Admitting: Family Medicine

## 2018-05-17 NOTE — Telephone Encounter (Signed)
Letter written. Patient advised.

## 2018-05-17 NOTE — Telephone Encounter (Signed)
Pt requesting a work note to be excused from work 8/29 and 8/30 due to dropping a weight on her foot.  States she was seen and given a work but not said to return to work today.  States she is requesting another note for an extra 2 days due to job duties.

## 2018-05-17 NOTE — Telephone Encounter (Signed)
ok 

## 2018-05-23 ENCOUNTER — Ambulatory Visit (INDEPENDENT_AMBULATORY_CARE_PROVIDER_SITE_OTHER): Payer: 59 | Admitting: Family Medicine

## 2018-05-23 DIAGNOSIS — S99922D Unspecified injury of left foot, subsequent encounter: Secondary | ICD-10-CM | POA: Insufficient documentation

## 2018-05-23 NOTE — Progress Notes (Signed)
Daisy Foley  MRN: [id number] DOB: 1981-05-28  Subjective:  HPI   The patient is a 37 year old female who presents today for re-evaluation of her foot.  The patient was seen last week after she had dropped a weight from the gym on her foot.  Imaging of the foot was negative.  She was referred to Podiatry at the time and had not heard from them until today while in our office she received a call from their office and she was instructed by them to call back after we saw her today.   The patient states that she stayed off of the foot last week.  She returned to work yesterday.  She was getting better but after being at work where she has to be on her feet and moving a lot while in steel toed shoes, she was in significant pain by the end of the day.  She also related that her foot was extremely swollen from being up on it.   Patient Active Problem List   Diagnosis Date Noted  . Abnormal vaginal bleeding 01/09/2017  . STD exposure 01/09/2017  . ADD (attention deficit disorder) 06/29/2015  . Clinical depression 06/29/2015  . Esophagitis, reflux 06/29/2015  . Adult hypothyroidism 06/29/2015  . Headache, migraine 06/29/2015  . Pure hypercholesterolemia 06/29/2015  . Allergic rhinitis 07/16/2009  . Absolute anemia 07/16/2009  . Essential (primary) hypertension 07/16/2009  . Genital herpes 02/10/2009    Past Medical History:  Diagnosis Date  . Endometriosis/adenomyosis   . Fibroid   . Genital herpes     Social History   Socioeconomic History  . Marital status: Divorced    Spouse name: Not on file  . Number of children: Not on file  . Years of education: Not on file  . Highest education level: Not on file  Occupational History  . Not on file  Social Needs  . Financial resource strain: Not on file  . Food insecurity:    Worry: Not on file    Inability: Not on file  . Transportation needs:    Medical: Not on file    Non-medical: Not on file  Tobacco Use  . Smoking status:  Never Smoker  . Smokeless tobacco: Never Used  Substance and Sexual Activity  . Alcohol use: Yes    Alcohol/week: 0.0 standard drinks    Comment: occas  . Drug use: No    Comment: marijuana use twice this past weekend  . Sexual activity: Yes    Birth control/protection: Surgical  Lifestyle  . Physical activity:    Days per week: 3 days    Minutes per session: 60 min  . Stress: Not on file  Relationships  . Social connections:    Talks on phone: Not on file    Gets together: Not on file    Attends religious service: Not on file    Active member of club or organization: Not on file    Attends meetings of clubs or organizations: Not on file    Relationship status: Not on file  . Intimate partner violence:    Fear of current or ex partner: Not on file    Emotionally abused: Not on file    Physically abused: Not on file    Forced sexual activity: Not on file  Other Topics Concern  . Not on file  Social History Narrative  . Not on file    Outpatient Encounter Medications as of 05/23/2018  Medication Sig Note  .  acyclovir (ZOVIRAX) 400 MG tablet Take 400 mg by mouth 2 (two) times daily. 04/17/2018: PRN  . fluticasone (FLONASE) 50 MCG/ACT nasal spray Place 2 sprays into both nostrils daily.   . mometasone (ELOCON) 0.1 % cream Apply 1 application topically daily.   . naproxen (NAPROSYN) 500 MG tablet Take 1 tablet (500 mg total) by mouth 2 (two) times daily with a meal.    No facility-administered encounter medications on file as of 05/23/2018.     No Known Allergies  Review of Systems  Musculoskeletal: Negative for back pain, joint pain, myalgias and neck pain.       Patient with pain and swelling in the left foot.    Objective:  BP (!) 142/88 (BP Location: Right Arm, Patient Position: Sitting, Cuff Size: Normal)   Pulse 84   Temp 98.4 F (36.9 C) (Oral)   Resp 16   Wt 241 lb (109.3 kg)   BMI 32.69 kg/m   Physical Exam Swelling present of dorsum of left foot--some  tenderness along 4th toe and associated tendon. Assessment and Plan :  Foot Trauma Pt to see podiatry to see if any intervention other than RICE needed. Note written to be out of work until 05/28/18.  I have done the exam and reviewed the chart and it is accurate to the best of my knowledge. Development worker, community has been used and  any errors in dictation or transcription are unintentional. Miguel Aschoff M.D. [facility]

## 2018-05-24 ENCOUNTER — Encounter: Payer: Self-pay | Admitting: Podiatry

## 2018-05-24 ENCOUNTER — Ambulatory Visit (INDEPENDENT_AMBULATORY_CARE_PROVIDER_SITE_OTHER): Payer: 59

## 2018-05-24 ENCOUNTER — Other Ambulatory Visit: Payer: Self-pay | Admitting: Podiatry

## 2018-05-24 ENCOUNTER — Ambulatory Visit (INDEPENDENT_AMBULATORY_CARE_PROVIDER_SITE_OTHER): Payer: 59 | Admitting: Podiatry

## 2018-05-24 VITALS — BP 138/86 | HR 78

## 2018-05-24 DIAGNOSIS — S9032XA Contusion of left foot, initial encounter: Secondary | ICD-10-CM

## 2018-05-24 DIAGNOSIS — S92302A Fracture of unspecified metatarsal bone(s), left foot, initial encounter for closed fracture: Secondary | ICD-10-CM | POA: Diagnosis not present

## 2018-05-24 DIAGNOSIS — M79672 Pain in left foot: Secondary | ICD-10-CM | POA: Diagnosis not present

## 2018-05-24 DIAGNOSIS — S99922A Unspecified injury of left foot, initial encounter: Secondary | ICD-10-CM

## 2018-05-24 DIAGNOSIS — R600 Localized edema: Secondary | ICD-10-CM

## 2018-05-24 DIAGNOSIS — R269 Unspecified abnormalities of gait and mobility: Secondary | ICD-10-CM | POA: Diagnosis not present

## 2018-05-24 NOTE — Progress Notes (Signed)
  Subjective:  Patient ID: Daisy Foley, female    DOB: December 25, 1980,  MRN: [id number]  Chief Complaint  Patient presents with  . Foot Injury    left - dropped 5lb weight on her foot about 10 days ago - had xrays on 05/16/18, but not fracture was seen - tried to go back to work on 9/3, but wears steel-toed boot and stands on concrete all day - the pain and swelling were too much. Now out of work until 05/28/18    37 y.o. female presents with the above complaint. States the foot is not getting better.  Review of Systems: Negative except as noted in the HPI. Denies N/V/F/Ch.  Past Medical History:  Diagnosis Date  . Endometriosis/adenomyosis   . Fibroid   . Genital herpes     Current Outpatient Medications:  .  acyclovir (ZOVIRAX) 400 MG tablet, Take 400 mg by mouth 2 (two) times daily., Disp: , Rfl:  .  fluticasone (FLONASE) 50 MCG/ACT nasal spray, Place 2 sprays into both nostrils daily., Disp: 16 g, Rfl: 0 .  mometasone (ELOCON) 0.1 % cream, Apply 1 application topically daily., Disp: , Rfl:  .  naproxen (NAPROSYN) 500 MG tablet, Take 1 tablet (500 mg total) by mouth 2 (two) times daily with a meal., Disp: 60 tablet, Rfl: 0  Social History   Tobacco Use  Smoking Status Never Smoker  Smokeless Tobacco Never Used    No Known Allergies Objective:   Vitals:   05/24/18 1112  BP: 138/86  Pulse: 78   There is no height or weight on file to calculate BMI. Constitutional Well developed. Well nourished.  Vascular Dorsalis pedis pulses palpable bilaterally. Posterior tibial pulses palpable bilaterally. Capillary refill normal to all digits.  No cyanosis or clubbing noted. Pedal hair growth normal. Edema noted L foot.  Neurologic Normal speech. Oriented to person, place, and time. Epicritic sensation to light touch grossly present bilaterally.  Dermatologic Nails well groomed and normal in appearance. No open wounds. No skin lesions.  Orthopedic: Normal joint ROM without  pain or crepitus bilaterally. No visible deformities. POP L 3rd metatarsal shaft, 4th toe, 4th metatarsal shaft.   Radiographs: Cortical irregularity concerning for possible fracture left 3rd metatarsal. No other fractures seen. Assessment:   1. Closed fracture of metatarsal shaft, left, initial encounter   2. Localized edema   3. Gait disturbance   4. Acute foot pain, left    Plan:  Patient was evaluated and treated and all questions answered.  Fracture L 3rd Metatarsal with Edema. -Prior XR reviewed -New XR taken and reviewed as above. -Immobilize in CAM walker -Multilayer compression dressing consisting of unna, cast padding, Coban applied for edema reduction. -Continue icing and elevating.  Return in about 2 weeks (around 06/07/2018) for Possible Fracture F/u.

## 2018-05-28 ENCOUNTER — Encounter: Payer: Self-pay | Admitting: *Deleted

## 2018-05-28 ENCOUNTER — Telehealth: Payer: Self-pay | Admitting: Podiatry

## 2018-05-28 NOTE — Telephone Encounter (Signed)
I told pt I could write a note from her 1st appt with Dr. March Rummage to be out of work until reevaluated on 06/07/2018. Pt states that would be fine and she would like to pick that up in the [place] office.

## 2018-05-28 NOTE — Telephone Encounter (Signed)
I need a note to be out of work, due to me not being able to wear my steel toe shoes. Dr. March Rummage said he would give me a note to be out, not sure for how long. Injury happened on Aug 24th, and I have been unable to work since then, and its still not healed.

## 2018-06-07 ENCOUNTER — Ambulatory Visit (INDEPENDENT_AMBULATORY_CARE_PROVIDER_SITE_OTHER): Payer: 59 | Admitting: Podiatry

## 2018-06-07 ENCOUNTER — Encounter: Payer: Self-pay | Admitting: Podiatry

## 2018-06-07 ENCOUNTER — Ambulatory Visit (INDEPENDENT_AMBULATORY_CARE_PROVIDER_SITE_OTHER): Payer: 59

## 2018-06-07 ENCOUNTER — Other Ambulatory Visit: Payer: Self-pay | Admitting: Podiatry

## 2018-06-07 DIAGNOSIS — S92302S Fracture of unspecified metatarsal bone(s), left foot, sequela: Secondary | ICD-10-CM

## 2018-06-07 DIAGNOSIS — S92302A Fracture of unspecified metatarsal bone(s), left foot, initial encounter for closed fracture: Secondary | ICD-10-CM

## 2018-06-07 DIAGNOSIS — M779 Enthesopathy, unspecified: Secondary | ICD-10-CM

## 2018-06-07 DIAGNOSIS — S92302G Fracture of unspecified metatarsal bone(s), left foot, subsequent encounter for fracture with delayed healing: Secondary | ICD-10-CM

## 2018-06-07 NOTE — Progress Notes (Signed)
  Subjective:  Patient ID: Daisy Foley, female    DOB: 1981/02/24,  MRN: [id number]  Chief Complaint  Patient presents with  . Fracture    2 week follow up fracture left metatarsal    37 y.o. female presents with the above complaint.  States the foot is doing much better since last visit.  Complaining of pain to the left fourth toe area.  Review of Systems: Negative except as noted in the HPI. Denies N/V/F/Ch.  Past Medical History:  Diagnosis Date  . Endometriosis/adenomyosis   . Fibroid   . Genital herpes     Current Outpatient Medications:  .  acyclovir (ZOVIRAX) 400 MG tablet, Take 400 mg by mouth 2 (two) times daily., Disp: , Rfl:  .  fluticasone (FLONASE) 50 MCG/ACT nasal spray, Place 2 sprays into both nostrils daily., Disp: 16 g, Rfl: 0 .  mometasone (ELOCON) 0.1 % cream, Apply 1 application topically daily., Disp: , Rfl:  .  naproxen (NAPROSYN) 500 MG tablet, Take 1 tablet (500 mg total) by mouth 2 (two) times daily with a meal., Disp: 60 tablet, Rfl: 0  Social History   Tobacco Use  Smoking Status Never Smoker  Smokeless Tobacco Never Used    No Known Allergies Objective:   There were no vitals filed for this visit. There is no height or weight on file to calculate BMI. Constitutional Well developed. Well nourished.  Vascular Dorsalis pedis pulses palpable bilaterally. Posterior tibial pulses palpable bilaterally. Capillary refill normal to all digits.  No cyanosis or clubbing noted. Pedal hair growth normal. Edema noted L foot.  Neurologic Normal speech. Oriented to person, place, and time. Epicritic sensation to light touch grossly present bilaterally.  Dermatologic Nails well groomed and normal in appearance. No open wounds. No skin lesions.  Orthopedic: Normal joint ROM without pain or crepitus bilaterally. No visible deformities. POP L 3rd metatarsal shaft Pain palpation about the left fourth MPJ   Radiographs: Continued cortical regularity  but no worsening of the third metatarsal Assessment:   1. Closed fracture of metatarsal shaft, left, initial encounter   2. Capsulitis    Plan:  Patient was evaluated and treated and all questions answered.  Fracture L 3rd Metatarsal with Edema. -Still with some pain with palpation but otherwise improving. -Transition to normal shoe gear  Capsulitis left fourth MPJ -Injection delivered to the left fourth MPJ as below  Procedure: Joint Injection Location: Left 4th MPJ joint Skin Prep: Alcohol. Injectate: 0.5 cc 1% lidocaine plain, 0.5 cc dexamethasone phosphate. Disposition: Patient tolerated procedure well. Injection site dressed with a band-aid.    Return in about 3 weeks (around 06/28/2018) for Capsulitis, Left.

## 2018-06-26 DIAGNOSIS — Z23 Encounter for immunization: Secondary | ICD-10-CM | POA: Diagnosis not present

## 2018-06-28 ENCOUNTER — Ambulatory Visit: Payer: 59 | Admitting: Podiatry

## 2018-08-31 ENCOUNTER — Ambulatory Visit (INDEPENDENT_AMBULATORY_CARE_PROVIDER_SITE_OTHER): Payer: 59 | Admitting: Physician Assistant

## 2018-08-31 ENCOUNTER — Encounter: Payer: Self-pay | Admitting: Physician Assistant

## 2018-08-31 VITALS — BP 128/86 | HR 62 | Temp 98.0°F | Resp 16 | Wt 237.0 lb

## 2018-08-31 DIAGNOSIS — M25511 Pain in right shoulder: Secondary | ICD-10-CM | POA: Diagnosis not present

## 2018-08-31 MED ORDER — METHYLPREDNISOLONE 4 MG PO TBPK: ORAL_TABLET | ORAL | 0 refills | Status: DC

## 2018-08-31 MED ORDER — CYCLOBENZAPRINE HCL 5 MG PO TABS: 5.0000 mg | ORAL_TABLET | Freq: Three times a day (TID) | ORAL | 1 refills | Status: DC | PRN

## 2018-08-31 NOTE — Progress Notes (Signed)
Patient: Daisy Foley Female    DOB: [birth year]   37 y.o.   MRN: [id number] Visit Date: 08/31/2018  Today's Provider: Mar Daring, PA-C   Chief Complaint  Patient presents with  . Shoulder Pain   Subjective:     HPI Patient here today c/o right shoulder pain worse since yesterday. Patient reports that she has been dealing with pain on and off x's one month. Patient reports taking Naproxen 500 mg reports no symptom control. Patient reports she does lift heavy things at work and does work out. Patient reports pain is like a stinging and burning sensation. Patient denies any swelling or bruising.   No Known Allergies   Current Outpatient Medications:  .  fluticasone (FLONASE) 50 MCG/ACT nasal spray, Place 2 sprays into both nostrils daily., Disp: 16 g, Rfl: 0 .  mometasone (ELOCON) 0.1 % cream, Apply 1 application topically daily., Disp: , Rfl:  .  naproxen (NAPROSYN) 500 MG tablet, Take 1 tablet (500 mg total) by mouth 2 (two) times daily with a meal., Disp: 60 tablet, Rfl: 0 .  acyclovir (ZOVIRAX) 400 MG tablet, Take 400 mg by mouth 2 (two) times daily., Disp: , Rfl:   Review of Systems  Constitutional: Negative.   Respiratory: Negative.   Cardiovascular: Negative.   Musculoskeletal: Positive for myalgias.    Social History   Tobacco Use  . Smoking status: Never Smoker  . Smokeless tobacco: Never Used  Substance Use Topics  . Alcohol use: Yes    Alcohol/week: 0.0 standard drinks    Comment: occas      Objective:   BP 128/86 (BP Location: Left Arm, Patient Position: Sitting, Cuff Size: Normal)   Pulse 62   Temp 98 F (36.7 C) (Oral)   Resp 16   Wt 237 lb (107.5 kg)   BMI 32.14 kg/m  Vitals:   08/31/18 0724  BP: 128/86  Pulse: 62  Resp: 16  Temp: 98 F (36.7 C)  TempSrc: Oral  Weight: 237 lb (107.5 kg)     Physical Exam Vitals signs reviewed.  Constitutional:      General: She is not in acute distress.    Appearance: She is  well-developed. She is not diaphoretic.  Neck:     Musculoskeletal: Normal range of motion and neck supple.     Thyroid: No thyromegaly.     Vascular: No JVD.     Trachea: No tracheal deviation.  Cardiovascular:     Rate and Rhythm: Normal rate and regular rhythm.     Heart sounds: Normal heart sounds. No murmur. No friction rub. No gallop.   Pulmonary:     Effort: Pulmonary effort is normal. No respiratory distress.     Breath sounds: Normal breath sounds. No wheezing or rales.  Musculoskeletal:     Right shoulder: She exhibits normal range of motion, no tenderness, no bony tenderness, normal pulse and normal strength.     Cervical back: She exhibits tenderness and spasm. She exhibits normal range of motion and no bony tenderness.       Back:  Lymphadenopathy:     Cervical: No cervical adenopathy.         Assessment & Plan    1. Trigger point of right shoulder region Will treat with medrol dose papk and flexeril as below. Continue massage therapy. Call if no improvements and may consider trigger point injection.  - methylPREDNISolone (MEDROL) 4 MG TBPK tablet; 6 day taper; take as directed  on package instructions  Dispense: 21 tablet; Refill: 0 - cyclobenzaprine (FLEXERIL) 5 MG tablet; Take 1 tablet (5 mg total) by mouth 3 (three) times daily as needed for muscle spasms.  Dispense: 30 tablet; Refill: [place], PA-C  [facility]

## 2018-09-07 ENCOUNTER — Ambulatory Visit (INDEPENDENT_AMBULATORY_CARE_PROVIDER_SITE_OTHER): Payer: 59 | Admitting: Physician Assistant

## 2018-09-07 ENCOUNTER — Encounter: Payer: Self-pay | Admitting: Physician Assistant

## 2018-09-07 VITALS — BP 124/84 | HR 72 | Temp 98.6°F | Resp 16 | Wt 240.0 lb

## 2018-09-07 DIAGNOSIS — M25511 Pain in right shoulder: Secondary | ICD-10-CM

## 2018-09-07 DIAGNOSIS — E6609 Other obesity due to excess calories: Secondary | ICD-10-CM

## 2018-09-07 DIAGNOSIS — Z6832 Body mass index (BMI) 32.0-32.9, adult: Secondary | ICD-10-CM

## 2018-09-07 MED ORDER — LIDOCAINE 5 % EX CREA: 1.0000 "application " | TOPICAL_CREAM | Freq: Four times a day (QID) | CUTANEOUS | 0 refills | Status: DC | PRN

## 2018-09-07 MED ORDER — CYCLOBENZAPRINE HCL 5 MG PO TABS: 5.0000 mg | ORAL_TABLET | Freq: Three times a day (TID) | ORAL | 1 refills | Status: DC | PRN

## 2018-09-07 NOTE — Progress Notes (Signed)
Patient: Daisy Foley Female    DOB: [birth year]   37 y.o.   MRN: [id number] Visit Date: 09/07/2018  Today's Provider: Mar Daring, PA-C   Chief Complaint  Patient presents with  . Follow-up   Subjective:     HPI  Follow up for trigger point of right shoulder region  The patient was last seen for this 1 weeks ago. Changes made at last visit include start prednisone and flexeril.  She reports excellent compliance with treatment. She feels that condition is Unchanged. She is not having side effects. Patient reports she is still having a lot of pain with activity and especially with work. Patient reports she did PT, Chiropractic and massages.   Patient reports she had thought symptoms were getting better with rest and above treatments. Then she had to return back to work and re-aggravated it. Since then the pain has been worse again.  ------------------------------------------------------------------------------------    No Known Allergies   Current Outpatient Medications:  .  acyclovir (ZOVIRAX) 400 MG tablet, Take 400 mg by mouth 2 (two) times daily., Disp: , Rfl:  .  cyclobenzaprine (FLEXERIL) 5 MG tablet, Take 1 tablet (5 mg total) by mouth 3 (three) times daily as needed for muscle spasms., Disp: 30 tablet, Rfl: 1 .  fluticasone (FLONASE) 50 MCG/ACT nasal spray, Place 2 sprays into both nostrils daily., Disp: 16 g, Rfl: 0 .  mometasone (ELOCON) 0.1 % cream, Apply 1 application topically daily., Disp: , Rfl:  .  naproxen (NAPROSYN) 500 MG tablet, Take 1 tablet (500 mg total) by mouth 2 (two) times daily with a meal., Disp: 60 tablet, Rfl: 0  Review of Systems  Constitutional: Negative.   Respiratory: Negative.   Cardiovascular: Negative.   Musculoskeletal: Positive for arthralgias and myalgias.  Neurological: Negative for weakness and numbness.    Social History   Tobacco Use  . Smoking status: Never Smoker  . Smokeless tobacco: Never Used    Substance Use Topics  . Alcohol use: Yes    Alcohol/week: 0.0 standard drinks    Comment: occas      Objective:   BP 124/84 (BP Location: Left Arm, Patient Position: Sitting, Cuff Size: Normal)   Pulse 72   Temp 98.6 F (37 C) (Oral)   Resp 16   Wt 240 lb (108.9 kg)   BMI 32.55 kg/m  Vitals:   09/07/18 0852  BP: 124/84  Pulse: 72  Resp: 16  Temp: 98.6 F (37 C)  TempSrc: Oral  Weight: 240 lb (108.9 kg)     Physical Exam Vitals signs reviewed.  Constitutional:      Appearance: She is well-developed.  HENT:     Head: Normocephalic and atraumatic.  Neck:     Musculoskeletal: Normal range of motion and neck supple.  Pulmonary:     Effort: Pulmonary effort is normal. No respiratory distress.  Musculoskeletal:       Back:  Psychiatric:        Behavior: Behavior normal.        Thought Content: Thought content normal.        Judgment: Judgment normal.         Assessment & Plan    1. Trigger point of right shoulder region Referral to PT for consideration of dry needling and strengthening exercises to prevent recurrence. Continue muscle relaxer prn. Aleve prn. Lidocaine topical cream for pain. Work note putting patient out until 10/08/18 since patient's employer does not have light duty.  -  Ambulatory referral to Physical Therapy - cyclobenzaprine (FLEXERIL) 5 MG tablet; Take 1 tablet (5 mg total) by mouth 3 (three) times daily as needed for muscle spasms.  Dispense: 30 tablet; Refill: 1 - Lidocaine 5 % CREA; Apply 1 application topically every 6 (six) hours as needed.  Dispense: 30 g; Refill: 0  2. Class 1 obesity due to excess calories without serious comorbidity with body mass index (BMI) of 32.0 to 32.9 in adult Counseled patient on healthy lifestyle modifications including dieting and exercise.      Mar Daring, PA-C  [facility]

## 2018-09-07 NOTE — Patient Instructions (Signed)

## 2018-09-24 ENCOUNTER — Encounter: Payer: Self-pay | Admitting: Physician Assistant

## 2018-09-27 NOTE — Progress Notes (Signed)
Patient: Daisy Foley Female    DOB: November 30, 1980   38 y.o.   MRN: [id number] Visit Date: 10/01/2018  Today's Provider: Mar Daring, PA-C   Chief Complaint  Patient presents with  . Follow-up   Subjective:     HPI   Trigger point of right shoulder region From 09/07/2018-Referred to PT for consideration of dry needling and strengthening exercises to prevent recurrence. Continue muscle relaxer prn. Aleve prn. Lidocaine topical cream for pain. Work note putting patient out until 10/08/18 since patient's employer does not have light duty.   Patient reports minimal improvements. She reports she is having good days and bad days. On bad days she will take the flexeril TID. Good days she may only take once or twice. She is continuing to take aleve 440mg  BID. PT starts on Wednesday 10/01/18.  No Known Allergies   Current Outpatient Medications:  .  acyclovir (ZOVIRAX) 400 MG tablet, Take 400 mg by mouth 2 (two) times daily., Disp: , Rfl:  .  cyclobenzaprine (FLEXERIL) 5 MG tablet, Take 1 tablet (5 mg total) by mouth 3 (three) times daily as needed for muscle spasms., Disp: 30 tablet, Rfl: 1 .  fluticasone (FLONASE) 50 MCG/ACT nasal spray, Place 2 sprays into both nostrils daily., Disp: 16 g, Rfl: 0 .  naproxen (NAPROSYN) 500 MG tablet, Take 1 tablet (500 mg total) by mouth 2 (two) times daily with a meal., Disp: 60 tablet, Rfl: 0 .  Lidocaine 5 % CREA, Apply 1 application topically every 6 (six) hours as needed. (Patient not taking: Reported on 10/01/2018), Disp: 30 g, Rfl: 0 .  mometasone (ELOCON) 0.1 % cream, Apply 1 application topically daily., Disp: , Rfl:   Review of Systems  Constitutional: Negative for appetite change, chills, fatigue and fever.  Respiratory: Negative for chest tightness and shortness of breath.   Cardiovascular: Negative for chest pain and palpitations.  Gastrointestinal: Negative for abdominal pain, nausea and vomiting.  Musculoskeletal: Positive  for arthralgias and myalgias.  Neurological: Positive for numbness (intermittent). Negative for dizziness and weakness.    Social History   Tobacco Use  . Smoking status: Never Smoker  . Smokeless tobacco: Never Used  Substance Use Topics  . Alcohol use: Yes    Alcohol/week: 0.0 standard drinks    Comment: occas      Objective:   BP 110/64 (BP Location: Left Arm, Patient Position: Sitting, Cuff Size: Large)   Pulse 86   Temp 98 F (36.7 C) (Oral)   Resp 16   Wt 244 lb (110.7 kg)   SpO2 99%   BMI 33.09 kg/m  Vitals:   10/01/18 0803  BP: 110/64  Pulse: 86  Resp: 16  Temp: 98 F (36.7 C)  TempSrc: Oral  SpO2: 99%  Weight: 244 lb (110.7 kg)     Physical Exam Vitals signs reviewed.  Constitutional:      Appearance: She is well-developed.  HENT:     Head: Normocephalic and atraumatic.  Neck:     Musculoskeletal: Normal range of motion and neck supple.  Pulmonary:     Effort: Pulmonary effort is normal. No respiratory distress.  Musculoskeletal:     Right shoulder: She exhibits decreased range of motion, tenderness and spasm. She exhibits no bony tenderness, no swelling, normal pulse and normal strength.     Cervical back: She exhibits decreased range of motion, tenderness and spasm. She exhibits no bony tenderness.       Back:  Psychiatric:  Behavior: Behavior normal.        Thought Content: Thought content normal.        Judgment: Judgment normal.        Assessment & Plan    1. Trigger point of shoulder region, right Continue flexeril and naproxen BID at home. Continue heat and massage. PT starts Wednesday. Will plan for return to work on 10/08/18 at this time. Will require intermittent leave for PT appointments.     Mar Daring, PA-C  [facility]

## 2018-10-01 ENCOUNTER — Encounter: Payer: Self-pay | Admitting: Physician Assistant

## 2018-10-01 ENCOUNTER — Ambulatory Visit (INDEPENDENT_AMBULATORY_CARE_PROVIDER_SITE_OTHER): Payer: 59 | Admitting: Physician Assistant

## 2018-10-01 VITALS — BP 110/64 | HR 86 | Temp 98.0°F | Resp 16 | Wt 244.0 lb

## 2018-10-01 DIAGNOSIS — M25511 Pain in right shoulder: Secondary | ICD-10-CM | POA: Diagnosis not present

## 2018-10-02 ENCOUNTER — Encounter: Payer: 59 | Admitting: Physical Therapy

## 2018-10-03 ENCOUNTER — Other Ambulatory Visit: Payer: Self-pay

## 2018-10-03 ENCOUNTER — Ambulatory Visit: Payer: 59 | Attending: Physician Assistant | Admitting: Physical Therapy

## 2018-10-03 DIAGNOSIS — M546 Pain in thoracic spine: Secondary | ICD-10-CM | POA: Insufficient documentation

## 2018-10-03 DIAGNOSIS — M542 Cervicalgia: Secondary | ICD-10-CM | POA: Insufficient documentation

## 2018-10-03 DIAGNOSIS — M25511 Pain in right shoulder: Secondary | ICD-10-CM | POA: Diagnosis present

## 2018-10-03 DIAGNOSIS — M6281 Muscle weakness (generalized): Secondary | ICD-10-CM | POA: Diagnosis present

## 2018-10-03 DIAGNOSIS — M62838 Other muscle spasm: Secondary | ICD-10-CM | POA: Diagnosis not present

## 2018-10-03 NOTE — Therapy (Signed)
[facility] [address], [place], [address] Phone: [phone]   Fax:  [phone]  Physical Therapy Evaluation  Patient Details  Name: Daisy Foley MRN: [id number] Date of Birth: Aug 01, 1981 No data recorded  Encounter Date: 10/03/2018  PT End of Session - 10/05/18 1547    Visit Number  1    Number of Visits  12    Date for PT Re-Evaluation  11/16/18    Authorization Type  [employer] Other reporting period from 10/03/2018    Authorization Time Period  Current cert period: 8/58/8502 - 11/16/2018 (last PN: [facility] 10/03/2018)    Authorization - Visit Number  1    Authorization - Number of Visits  10    PT Start Time  1520    PT Stop Time  1615    PT Time Calculation (min)  55 min    Activity Tolerance  Patient tolerated treatment well    Behavior During Therapy  [facility] for tasks assessed/performed       Past Medical History:  Diagnosis Date  . Endometriosis/adenomyosis   . Fibroid   . Genital herpes     Past Surgical History:  Procedure Laterality Date  . DILATION AND CURETTAGE OF UTERUS    . ENDOMETRIAL ABLATION    . Laparoscopic supracervical hysterectomy bilateral salpingectomy  2015   and BS- with [facility] - [facility]  . PELVIC LAPAROSCOPY      There were no vitals filed for this visit.    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 Her left sided pain eventually pushed her out of the massage therapist field. The only thing that improved the left shoulder pain was dry needling. 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 She was working out on top of that, mainly with weights. She states in mid October 2019 she started having intermittent pain near her right upper trap over the last month. One day it suddenly felt like the  right shoulder was on fire when she lifted up her sandwich for lunch. She had to leave work due to the pain. She did not go back to work due to it continuing to feel bad. She went to the chiropractor because in the past she had success with dry needling and acupuncture there on the left. She went for one visit where some acupuncture was performed but could not return for 3 times a week as suggested due to financial limitations. She also had the estim on there. She felt like she wasn't being listened too well and they focus was too much on the left side when her current problem is the right side. She is unsure if the treatment helped. She noted that a tool (described similar to a theragun) was used there and hit her spine and she feels that made her condition it worse. She denies that her spine was examined by the chiropractic office.  She denies history of neck pain outside of a crick in the neck, nothing like this. The last three days have been terrible. She thought she was feeling better but then it got much worse. She has tried everything she can think of drawing on her massage therapist background and the extensive tools at her house, such  as theracane and therapeutic tools she used as a massage therapist without lasting relief. She does not remember any acute injury. She state she can be just sitting there and the pain flairs up. She has stopped working out because of the pain. She notes a dosepac of prednisone helped for a while. She was unable to continue with chiropractic care due to financial limitations associated with being out of work. She strongly feels the only thing that will help her pain is dry needling and that is her main reason for coming to PT.     Pertinent History  Patient is a 38 y.o. female who presents to outpatient physical therapy with a referral for medical diagnosis trigger point of right shoulder region. This patient's chief complaints consist of right upper trap/levator scap pain,  decreased use of right UE, inability to work, leading to the following functional deficits: currently unable to tolerate work tasks, difficulty with all tasks involving right UE. Relevant past medical history and comorbidities include hypertension, hypothyroidism, ADD, history of headaches, history of similar pain in left side.     Patient Stated Goals  he strongly feels the only thing that will help her pain is dry needling and that is her main reason for coming to PT. Resolve pain and return to work and usual activities including working out.     Currently in Pain?  Yes    Pain Score  2    Patient reports current pain as 2/10, at best 0/10, at worst 10/10.    Pain Location  Shoulder   right upper trap/levator scap region   Pain Orientation  Right    Pain Descriptors / Indicators  Burning;Spasm;Sharp;Throbbing   burring when its the worst, sharp pulsing/spasming   Pain Type  Acute pain;Chronic pain    Pain Radiating Towards  Paresthesia: to right hand when she sleeps on it, wakes her up but goes away when she changes positions.     Pain Onset  More than a month ago    Aggravating Factors   any type of anything (doing dishes, doing laundry), confusing to her as sometimes she is doing nothing    Pain Relieving Factors  theracane, help, lidocain cream, muscle relaxer (but make her feel like a zombie).     Effect of Pain on Daily Activities  currently unable to tolerate work tasks, difficulty with all tasks involving right UE.          [facility] PT Assessment - 10/05/18 0001      Assessment   Onset Date/Surgical Date  08/03/18    Next MD Visit  none scheduled yet    Prior Therapy  no PT for this problem. Chiropractic care with minimal effect and only one visit.       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     Leisure   likes to work out, spending time with kids (66 and 63 years old).       Cognition   Overall Cognitive Status  Within Functional Limits for tasks assessed      Observation/Other Assessments   Observations  see note from  10/03/2018 for latest objective data    Focus on Therapeutic Outcomes (FOTO)   56          OBJECTIVE: OBSERVATION/INSPECTION: Patient presents with posture within functional limits. Mildly forward head and exaggerated kyphosis at CT junction.   NEUROLOGICAL: Dermatomes: BUE WNL to light touch.  Myotomes: BUE WFL.  SPINE MOTION Cervical Spine AROM:  *Indicates pain - Flexion: = 50 egrees. - Extension: = 45 degrees (provokes). - Rotation: R= 55 degrees, L = 55 degrees (provokes) . - Side Flexion: R= 45 degrees, L = 45 degrees - Right levator scap stretch = strong reproduction of pain; Left levator scap stretch = no effect.   PERIPHERAL JOINT MOTION (AROM/PROM in degrees):  *Indicates pain Shoulder  - Flexion: R = 5/5, L = 5/5. - Abduction: R = 5/5*, L = 5/5. - External rotation: R = 3+/5**, L = 4/5. - Internal rotation: R = 5/5, L = 5/5. Elbow - Flexion: R = 5/5, L = 5/5..  - Extension: R = 5/5, L = 5/5.  STRENGTH:  *Indicates pain Shoulder  - Flexion: R = 5/5, L = 5/5. - Abduction: R = 5/5*, L = 5/5. - External rotation: R = 3+/5**, L = 4/5. - Internal rotation: R = 5/5, L = 5/5. Elbow - Flexion: R = 5/5, L = 5/5..  - Extension: R = 5/5, L = 5/5.  SPECIAL TESTS: - Cervical Axial Distraction: feels good - Cervical Axial Compression: no effect - ER MMT R= painful + weak - Impingement test (hawkin's- kennedy) R = painful.   ACCESSORY MOTION:  - Reproduction of pain and hypomobile to CPA at upper thoracic and lower cervical spinal segments.  PALPATION: - TTP with trigger point noted in right  upper trapp and levator scap. Mildly tender at those muscles in left. Decreased pain following STM and manual trigger point release  REPEATED MOTIONS TESTING (completed after manual treatment when pt's symptoms decreased): - Repeated cervical retraction: during = no effect but stiff; after = no effect - Repeated cervical retraction to extension: during = increasing left upper trap pain; after = no worse.   Objective measurements completed on examination: See above findings.      TREATMENT:  Denies history of spinal surgery Denies sensitivity to latex Denies history of long term steroid use  Therapeutic exercise: to centralize symptoms and improve ROM and strength required for successful completion of functional activities.  -Education on diagnosis, prognosis, POC, anatomy and physiology of current condition.  - Education on HEP including handout  - Repeated cervical retraction x10: during = no effect but stiff; after = no effect - Repeated cervical retraction to extension x10: during = increasing left upper trap pain; after = no worse.  - postural correction in seated position with lumbar roll with instructions for home use, to improve lumbar position and decrease irritation during seated postures.    Manual therapy: to reduce pain and tissue tension, improve range of motion, neuromodulation, in order to promote improved ability to complete functional activities. - STM with trigger point release to right upper trap/levator scap. Resulted in decreased pain and tension.  - CPA grade II-IV to upper thoracic and lower cervical segments to improve mobility and decrease pain longer term. Uncomfortable during but no worse after.     HOME EXERCISE PROGRAM Access Code: [email or web address]  URL: [email or web address]  Date: 10/03/2018  Prepared by: Rosita Kea   Exercises  Seated Posture with Lumbar Roll  Seated Cervical Retraction - 10-15 reps -  1 second hold - 4x daily  Cervical  Retraction with Overpressure - 10-15 reps - 1 second hold - 4x daily  Seated Cervical Retraction and Extension - 10-15 reps - 1 second hold - 4x daily  Patient response to treatment:  Pt tolerated treatment well. Pt was able to complete all exercises with minimal to no lasting increase in pain or discomfort. She had improved symptomatic response following manual techniques.  Pt required multimodal cuing for proper technique and to facilitate improved neuromuscular control, strength, range of motion, and functional ability resulting in improved performance and form.   PT Education - 10/05/18 1546    Education Details   Exercise purpose/form. Self management techniques. Education on diagnosis, prognosis, POC, anatomy and physiology of current condition Education on HEP including handout     Person(s) Educated  Patient    Methods  Explanation;Demonstration;Tactile cues;Verbal cues;Handout    Comprehension  Verbalized understanding;Returned demonstration       PT Short Term Goals - 10/05/18 1602      PT SHORT TERM GOAL #1   Title  Be independent with initial home exercise program for self-management of symptoms.    Baseline  Initial HEP provided at IE (10/03/2018);     Time  2    Period  Weeks    Status  New    Target Date  10/19/18        PT Long Term Goals - 10/05/18 1602      PT LONG TERM GOAL #1   Title  Be independent with initial home exercise program for self-management of symptoms.    Baseline  Initial HEP provided at IE (10/03/2018);     Time  6    Period  Weeks    Status  New    Target Date  11/16/18      PT LONG TERM GOAL #2   Title  Demonstrate improved FOTO score by 10 units to demonstrate improvement in overall condition and self-reported functional ability.     Baseline  56 (10/03/2018);     Time  6    Period  Weeks    Status  New    Target Date  11/16/18      PT LONG TERM GOAL #3   Title  Improve right  shoulder strength to equal or greater than  5/5 in all  planes for improved ability to allow patient to return to work and exercise routine.     Baseline  see objective exam (10/03/2018);     Time  6    Period  Weeks    Status  New    Target Date  11/16/18      PT LONG TERM GOAL #4   Title  Demonstrate full cervical spine  AROM  with no compensations or increase in pain in all planes except intermittent end range discomfort to allow patient to complete valued activities with less difficulty.     Baseline  see objective exam (10/03/2018);     Time  6    Period  Weeks    Status  New    Target Date  11/16/18      PT LONG TERM GOAL #5   Title  Complete community, work and/or recreational activities without limitation due to current condition.     Baseline  pt currently unable to work or participate in fitness program (10/03/2018);     Time  6    Period  Weeks    Status  New  Target Date  11/16/18             Plan - 10/05/18 1548    Clinical Impression Statement  Patient is a 38 y.o. female referred to outpatient physical therapy with a medical diagnosis of trigger point of right shoulder region referred for dry needling and exercise who presents with signs and symptoms consistent with right upper trap/levator scap pain and increased tissue tension with possible referral from upper thoracic and lower cervical spine. Patient was tender with reproduction of pain with CPA to upper thoracic and lower cervical spine that suggest that region as a source of pain. Patient presents with significant pain, activity tolerance, muscle tension, ROM, and strength, joint stiffness impairments that are limiting ability to complete all activities requiring use of right UE work activities, or other activities that require upper quarter exertion, sleeping, ADLs, and IADLs without difficulty. Patient will benefit from skilled physical therapy intervention to address current body structure impairments and activity limitations to improve function and work towards  goals set in current POC in order to return to prior level of function or maximal functional improvement.     History and Personal Factors relevant to plan of care:  hypertension, hypothyroidism, ADD, history of headaches, history of similar pain in left side.     Clinical Presentation  Evolving    Clinical Presentation due to:  Patient's pain is not resolving as expected    Clinical Decision Making  Moderate    Rehab Potential  Good    Clinical Impairments Affecting Rehab Potential  (+) motivation to get better, good underlying health, active lifestyle (-) possible over-reliance on passive interventions, severity and recurrent nature of problem, problem has not resolved with other interventions so far    PT Frequency  2x / week    PT Duration  6 weeks    PT Treatment/Interventions  ADLs/Self Care Home Management;Cryotherapy;Electrical Stimulation;Moist Heat;Traction;Therapeutic activities;Therapeutic exercise;Neuromuscular re-education;Patient/family education;Manual techniques;Passive range of motion;Dry needling;Taping;Joint Manipulations;Spinal Manipulations;Other (comment)   joint mobmilizations grades I-IV   PT Next Visit Plan  dry needling    PT Home Exercise Plan  Medbridge Access Code: [email or web address]     Consulted and Agree with Plan of Care  Patient         Patient will benefit from skilled therapeutic intervention in order to improve the following deficits and impairments:  Decreased activity tolerance, Decreased endurance, Decreased range of motion, Decreased strength, Hypomobility, Increased fascial restricitons, Impaired perceived functional ability, Impaired sensation, Impaired UE functional use, Pain, Decreased mobility, Increased muscle spasms, Impaired flexibility, Impaired tone, Postural dysfunction, Other (comment)(decreased/incomplete knowledge of appropriate self-management techniques)  Visit Diagnosis: Other muscle spasm  Cervicalgia  Pain in thoracic spine  Right  shoulder pain, unspecified chronicity  Muscle weakness (generalized)     Problem List Patient Active Problem List   Diagnosis Date Noted  . Foot trauma, left, subsequent encounter 05/23/2018  . Abnormal vaginal bleeding 01/09/2017  . STD exposure 01/09/2017  . ADD (attention deficit disorder) 06/29/2015  . Clinical depression 06/29/2015  . Esophagitis, reflux 06/29/2015  . Adult hypothyroidism 06/29/2015  . Headache, migraine 06/29/2015  . Pure hypercholesterolemia 06/29/2015  . Allergic rhinitis 07/16/2009  . Absolute anemia 07/16/2009  . Essential (primary) hypertension 07/16/2009  . Genital herpes 02/10/2009    Nancy Nordmann, PT, DPT 10/05/2018, 4:07 PM  [facility] [address], [place], [address] Phone: [phone]   Fax:  [phone]  Name: Daisy Foley MRN:  [id number] Date of Birth: 05/04/1981

## 2018-10-04 ENCOUNTER — Encounter: Payer: 59 | Admitting: Physical Therapy

## 2018-10-05 ENCOUNTER — Encounter: Payer: Self-pay | Admitting: Physical Therapy

## 2018-10-08 ENCOUNTER — Ambulatory Visit: Payer: 59 | Admitting: Physical Therapy

## 2018-10-08 ENCOUNTER — Encounter: Payer: Self-pay | Admitting: Physical Therapy

## 2018-10-08 ENCOUNTER — Encounter: Payer: 59 | Admitting: Physical Therapy

## 2018-10-08 DIAGNOSIS — M62838 Other muscle spasm: Secondary | ICD-10-CM | POA: Diagnosis not present

## 2018-10-08 NOTE — Therapy (Signed)
[facility] [address], [place], [address] Phone: [phone]   Fax:  [phone]  Physical Therapy Treatment  Patient Details  Name: Daisy Foley MRN: [id number] Date of Birth: 1980-12-16 No data recorded  Encounter Date: 10/08/2018  PT End of Session - 10/08/18 0929    Visit Number  2    Number of Visits  12    Date for PT Re-Evaluation  11/16/18    Authorization Type  [employer] Other reporting period from 10/03/2018    Authorization Time Period  Current cert period: 03/28/6268 - 11/16/2018 (last PN: IE 10/03/2018)    Authorization - Visit Number  2    Authorization - Number of Visits  10    PT Start Time  0900    PT Stop Time  0945    PT Time Calculation (min)  45 min    Activity Tolerance  Patient tolerated treatment well    Behavior During Therapy  [facility] for tasks assessed/performed       Past Medical History:  Diagnosis Date  . Endometriosis/adenomyosis   . Fibroid   . Genital herpes     Past Surgical History:  Procedure Laterality Date  . DILATION AND CURETTAGE OF UTERUS    . ENDOMETRIAL ABLATION    . Laparoscopic supracervical hysterectomy bilateral salpingectomy  2015   and BS- with [facility] - [facility]  . PELVIC LAPAROSCOPY      There were no vitals filed for this visit.  Subjective Assessment - 10/08/18 0907    Subjective  Patient reports 3/10 pain this morning in the R side of the shoulder and neck this am. Patient reports she wants to get back to her weight training regimen. Patient is excited for possible TDN interventions    Pertinent History  Patient is a 38 y.o. female who presents to outpatient physical therapy with a referral for medical diagnosis trigger point of right shoulder region. This patient's chief complaints consist of right upper trap/levator scap pain, decreased use of right UE, inability to work, leading to the following functional deficits: currently unable to tolerate work  tasks, difficulty with all tasks involving right UE. Relevant past medical history and comorbidities include hypertension, hypothyroidism, ADD, history of headaches, history of similar pain in left side.     Patient Stated Goals  he strongly feels the only thing that will help her pain is dry needling and that is her main reason for coming to PT. Resolve pain and return to work and usual activities including working out.     Pain Onset  More than a month ago         Manual - STM and trigger point release R UT, levator, rhomboid, and R upper tspine, cervical paraspinals - Following: (5) 52mm .30needles placed along the R UT, R pec minor, and R latissimus/teres minor to decrease increased muscular spasms and trigger points with the patient positioned in supine, utilizing pincer grasp technique. Patient was educated on risks and benefits of therapy and verbally consents to PT. Decreased shoulder height in supine following  ESTIM + heat pack HiVolt ESTIM 10 min at patient tolerated 105V increased to 110V through treatment at R levator and UT area . Attempted to decrease muscle tension in this area. With PT assessing patient tolerance throughout (increasing intensity as needed), monitoring skin integrity (normal), with decreased pain noted from patient. Discussed postural education for maintenance of PT gains with demonstration. PT educated patient on UT  and levator stretch, which she demonstrated following                        PT Education - 10/08/18 0927    Education Details  TDN and ESTIM education    Person(s) Educated  Patient    Methods  Explanation    Comprehension  Verbalized understanding       PT Short Term Goals - 10/05/18 1602      PT SHORT TERM GOAL #1   Title  Be independent with initial home exercise program for self-management of symptoms.    Baseline  Initial HEP provided at IE (10/03/2018);     Time  2    Period  Weeks    Status  New    Target Date   10/19/18        PT Long Term Goals - 10/05/18 1602      PT LONG TERM GOAL #1   Title  Be independent with initial home exercise program for self-management of symptoms.    Baseline  Initial HEP provided at IE (10/03/2018);     Time  6    Period  Weeks    Status  New    Target Date  11/16/18      PT LONG TERM GOAL #2   Title  Demonstrate improved FOTO score by 10 units to demonstrate improvement in overall condition and self-reported functional ability.     Baseline  56 (10/03/2018);     Time  6    Period  Weeks    Status  New    Target Date  11/16/18      PT LONG TERM GOAL #3   Title  Improve right  shoulder strength to equal or greater than  5/5 in all planes for improved ability to allow patient to return to work and exercise routine.     Baseline  see objective exam (10/03/2018);     Time  6    Period  Weeks    Status  New    Target Date  11/16/18      PT LONG TERM GOAL #4   Title  Demonstrate full cervical spine  AROM  with no compensations or increase in pain in all planes except intermittent end range discomfort to allow patient to complete valued activities with less difficulty.     Baseline  see objective exam (10/03/2018);     Time  6    Period  Weeks    Status  New    Target Date  11/16/18      PT LONG TERM GOAL #5   Title  Complete community, work and/or recreational activities without limitation due to current condition.     Baseline  pt currently unable to work or participate in fitness program (10/03/2018);     Time  6    Period  Weeks    Status  New    Target Date  11/16/18            Plan - 10/08/18 1645    Clinical Impression Statement  PT utilized manual and modality techniques to decrease muscle tension. Following patient reports no pain and demonstrates full ROM; very pleased with results. Following pt verbalizes and demonstrates understanding of of postural education and stretching for maintainence of gains made during session.     Rehab  Potential  Good    Clinical Impairments Affecting Rehab Potential  (+) motivation to get better, good underlying  health, active lifestyle (-) possible over-reliance on passive interventions, severity and recurrent nature of problem, problem has not resolved with other interventions so far    PT Frequency  2x / week    PT Duration  6 weeks    PT Treatment/Interventions  ADLs/Self Care Home Management;Cryotherapy;Electrical Stimulation;Moist Heat;Traction;Therapeutic activities;Therapeutic exercise;Neuromuscular re-education;Patient/family education;Manual techniques;Passive range of motion;Dry needling;Taping;Joint Manipulations;Spinal Manipulations;Other (comment)    PT Next Visit Plan  dry needling    PT Home Exercise Plan  Medbridge Access Code: [email or web address]     Consulted and Agree with Plan of Care  Patient       Patient will benefit from skilled therapeutic intervention in order to improve the following deficits and impairments:  Decreased activity tolerance, Decreased endurance, Decreased range of motion, Decreased strength, Hypomobility, Increased fascial restricitons, Impaired perceived functional ability, Impaired sensation, Impaired UE functional use, Pain, Decreased mobility, Increased muscle spasms, Impaired flexibility, Impaired tone, Postural dysfunction, Other (comment)  Visit Diagnosis: Other muscle spasm     Problem List Patient Active Problem List   Diagnosis Date Noted  . Foot trauma, left, subsequent encounter 05/23/2018  . Abnormal vaginal bleeding 01/09/2017  . STD exposure 01/09/2017  . ADD (attention deficit disorder) 06/29/2015  . Clinical depression 06/29/2015  . Esophagitis, reflux 06/29/2015  . Adult hypothyroidism 06/29/2015  . Headache, migraine 06/29/2015  . Pure hypercholesterolemia 06/29/2015  . Allergic rhinitis 07/16/2009  . Absolute anemia 07/16/2009  . Essential (primary) hypertension 07/16/2009  . Genital herpes 02/10/2009   Shelton Silvas PT,  DPT Shelton Silvas 10/08/2018, 4:48 PM  [facility] [address] [place], [address] Phone: [phone]   Fax:  [phone]  Name: Marshea Wisher MRN: [id number] Date of Birth: Feb 16, 1981

## 2018-10-09 ENCOUNTER — Ambulatory Visit: Payer: 59 | Admitting: Physical Therapy

## 2018-10-11 ENCOUNTER — Ambulatory Visit: Payer: 59 | Admitting: Physical Therapy

## 2018-10-15 ENCOUNTER — Ambulatory Visit: Payer: 59 | Admitting: Physical Therapy

## 2018-10-15 ENCOUNTER — Encounter: Payer: Self-pay | Admitting: Physical Therapy

## 2018-10-15 ENCOUNTER — Encounter: Payer: Self-pay | Admitting: Physician Assistant

## 2018-10-15 DIAGNOSIS — M62838 Other muscle spasm: Secondary | ICD-10-CM | POA: Diagnosis not present

## 2018-10-15 NOTE — Therapy (Signed)
[facility] [address], [place], [address] Phone: [phone]   Fax:  [phone]  Physical Therapy Treatment  Patient Details  Name: Daisy Foley MRN: [id number] Date of Birth: July 05, 1981 No data recorded  Encounter Date: 10/15/2018  PT End of Session - 10/15/18 1456    Visit Number  3    Number of Visits  12    Date for PT Re-Evaluation  11/16/18    Authorization Type  [employer] Other reporting period from 10/03/2018    Authorization Time Period  Current cert period: 5/42/7062 - 11/16/2018 (last PN: IE 10/03/2018)    Authorization - Visit Number  3    Authorization - Number of Visits  10    PT Start Time  0230    PT Stop Time  0315    PT Time Calculation (min)  45 min    Activity Tolerance  Patient tolerated treatment well    Behavior During Therapy  [facility] for tasks assessed/performed       Past Medical History:  Diagnosis Date  . Endometriosis/adenomyosis   . Fibroid   . Genital herpes     Past Surgical History:  Procedure Laterality Date  . DILATION AND CURETTAGE OF UTERUS    . ENDOMETRIAL ABLATION    . Laparoscopic supracervical hysterectomy bilateral salpingectomy  2015   and BS- with [facility] - [facility]  . PELVIC LAPAROSCOPY      There were no vitals filed for this visit.  Subjective Assessment - 10/15/18 1433    Subjective  Patient reports following last session she felt "great", and continued to feel great until today. Patient reports increased pain today after she had to over exert herself at work d/t a Mudlogger being out. Patient reports she had to do a lot of lifting/pulling with RUE, and this is more painful than L UE. Patient reports UT pain this afternoon 6/10.     Pertinent History  Patient is a 38 y.o. female who presents to outpatient physical therapy with a referral for medical diagnosis trigger point of right shoulder region. This patient's chief complaints consist of right upper  trap/levator scap pain, decreased use of right UE, inability to work, leading to the following functional deficits: currently unable to tolerate work tasks, difficulty with all tasks involving right UE. Relevant past medical history and comorbidities include hypertension, hypothyroidism, ADD, history of headaches, history of similar pain in left side.     Patient Stated Goals  he strongly feels the only thing that will help her pain is dry needling and that is her main reason for coming to PT. Resolve pain and return to work and usual activities including working out.     Pain Onset  More than a month ago       Manual - STM and trigger point release R UT, levator, rhomboid, and R upper tspine, cervical paraspinals - Following:(5) 66mm .30needles placed along the R UT, and R llevator to decrease increased muscular spasms and trigger points with the patient positioned in supine, utilizing pincer grasp technique. Patient was educated on risks and benefits of therapy and verbally consents to PT. Decreased shoulder height in supine following  ESTIM+ heat packHiVolt ESTIM10 min at patient tolerated100Vincreased to125V through treatmentat b/l levator and UTarea. Attempted to decrease muscle tension in this area. With PT assessing patient tolerance throughout (increasing intensity as needed), monitoring skin integrity (normal), with decreased pain noted from patient. Discussed work Personal assistant with suggestions  on improvement (completing tasks in front of you rather than persistantly reaching and pulling things in front of you. Patient reports she is unable to raise/lower line, and reports she has to bend over the line d/t her height. PT educated patient to utilize squat position as able for heavier lifting.                         PT Education - 10/15/18 1455    Education Details  Postural ergonomic education    Person(s) Educated  Patient    Methods   Explanation;Demonstration;Verbal cues;Tactile cues    Comprehension  Verbalized understanding;Returned demonstration;Verbal cues required;Tactile cues required       PT Short Term Goals - 10/05/18 1602      PT SHORT TERM GOAL #1   Title  Be independent with initial home exercise program for self-management of symptoms.    Baseline  Initial HEP provided at IE (10/03/2018);     Time  2    Period  Weeks    Status  New    Target Date  10/19/18        PT Long Term Goals - 10/05/18 1602      PT LONG TERM GOAL #1   Title  Be independent with initial home exercise program for self-management of symptoms.    Baseline  Initial HEP provided at IE (10/03/2018);     Time  6    Period  Weeks    Status  New    Target Date  11/16/18      PT LONG TERM GOAL #2   Title  Demonstrate improved FOTO score by 10 units to demonstrate improvement in overall condition and self-reported functional ability.     Baseline  56 (10/03/2018);     Time  6    Period  Weeks    Status  New    Target Date  11/16/18      PT LONG TERM GOAL #3   Title  Improve right  shoulder strength to equal or greater than  5/5 in all planes for improved ability to allow patient to return to work and exercise routine.     Baseline  see objective exam (10/03/2018);     Time  6    Period  Weeks    Status  New    Target Date  11/16/18      PT LONG TERM GOAL #4   Title  Demonstrate full cervical spine  AROM  with no compensations or increase in pain in all planes except intermittent end range discomfort to allow patient to complete valued activities with less difficulty.     Baseline  see objective exam (10/03/2018);     Time  6    Period  Weeks    Status  New    Target Date  11/16/18      PT LONG TERM GOAL #5   Title  Complete community, work and/or recreational activities without limitation due to current condition.     Baseline  pt currently unable to work or participate in fitness program (10/03/2018);     Time  6     Period  Weeks    Status  New    Target Date  11/16/18            Plan - 10/15/18 1503    Clinical Impression Statement  PT and patient discussed work Youth worker. Patient is unable to change a lot about her  work station, PT encouraged patient to move around work station to utilize both UE as opposed to Risk analyst. PT also educated patient on returning to work out regimen of lifting weights with advisement to gradually return. Patient reports no pain following session, PT encouraged HEP for maintainence of session, which patient verbalized understanding of    Rehab Potential  Good    Clinical Impairments Affecting Rehab Potential  (+) motivation to get better, good underlying health, active lifestyle (-) possible over-reliance on passive interventions, severity and recurrent nature of problem, problem has not resolved with other interventions so far    PT Frequency  2x / week    PT Duration  6 weeks    PT Treatment/Interventions  ADLs/Self Care Home Management;Cryotherapy;Electrical Stimulation;Moist Heat;Traction;Therapeutic activities;Therapeutic exercise;Neuromuscular re-education;Patient/family education;Manual techniques;Passive range of motion;Dry needling;Taping;Joint Manipulations;Spinal Manipulations;Other (comment)    PT Next Visit Plan  dry needling    PT Home Exercise Plan  Medbridge Access Code: [email or web address]     Consulted and Agree with Plan of Care  Patient       Patient will benefit from skilled therapeutic intervention in order to improve the following deficits and impairments:  Decreased activity tolerance, Decreased endurance, Decreased range of motion, Decreased strength, Hypomobility, Increased fascial restricitons, Impaired perceived functional ability, Impaired sensation, Impaired UE functional use, Pain, Decreased mobility, Increased muscle spasms, Impaired flexibility, Impaired tone, Postural dysfunction, Other (comment)  Visit Diagnosis: Other muscle  spasm     Problem List Patient Active Problem List   Diagnosis Date Noted  . Foot trauma, left, subsequent encounter 05/23/2018  . Abnormal vaginal bleeding 01/09/2017  . STD exposure 01/09/2017  . ADD (attention deficit disorder) 06/29/2015  . Clinical depression 06/29/2015  . Esophagitis, reflux 06/29/2015  . Adult hypothyroidism 06/29/2015  . Headache, migraine 06/29/2015  . Pure hypercholesterolemia 06/29/2015  . Allergic rhinitis 07/16/2009  . Absolute anemia 07/16/2009  . Essential (primary) hypertension 07/16/2009  . Genital herpes 02/10/2009   Shelton Silvas PT, DPT Shelton Silvas 10/15/2018, 3:17 PM  [facility] [address] [place], [address] Phone: [phone]   Fax:  [phone]  Name: Daisy Foley MRN: [id number] Date of Birth: June 17, 1981

## 2018-10-16 ENCOUNTER — Ambulatory Visit: Payer: 59 | Admitting: Physical Therapy

## 2018-10-17 ENCOUNTER — Ambulatory Visit: Payer: 59 | Admitting: Physical Therapy

## 2018-10-18 ENCOUNTER — Ambulatory Visit: Payer: 59 | Admitting: Physical Therapy

## 2018-10-22 ENCOUNTER — Ambulatory Visit: Payer: 59 | Attending: Physician Assistant | Admitting: Physical Therapy

## 2018-10-25 ENCOUNTER — Ambulatory Visit: Payer: 59 | Admitting: Physical Therapy

## 2019-01-21 ENCOUNTER — Other Ambulatory Visit: Payer: Self-pay | Admitting: Family Medicine

## 2019-01-23 ENCOUNTER — Ambulatory Visit (INDEPENDENT_AMBULATORY_CARE_PROVIDER_SITE_OTHER): Payer: 59 | Admitting: Physician Assistant

## 2019-01-23 ENCOUNTER — Encounter: Payer: Self-pay | Admitting: Physician Assistant

## 2019-01-23 VITALS — Temp 98.2°F | Wt 230.0 lb

## 2019-01-23 DIAGNOSIS — G8929 Other chronic pain: Secondary | ICD-10-CM | POA: Diagnosis not present

## 2019-01-23 DIAGNOSIS — M25511 Pain in right shoulder: Secondary | ICD-10-CM | POA: Diagnosis not present

## 2019-01-23 DIAGNOSIS — M542 Cervicalgia: Secondary | ICD-10-CM | POA: Diagnosis not present

## 2019-01-23 MED ORDER — GABAPENTIN 100 MG PO CAPS: 100.0000 mg | ORAL_CAPSULE | Freq: Three times a day (TID) | ORAL | 0 refills | Status: DC

## 2019-01-23 MED ORDER — METHYLPREDNISOLONE 4 MG PO TBPK: ORAL_TABLET | ORAL | 0 refills | Status: DC

## 2019-01-23 MED ORDER — CYCLOBENZAPRINE HCL 5 MG PO TABS: 5.0000 mg | ORAL_TABLET | Freq: Three times a day (TID) | ORAL | 1 refills | Status: DC | PRN

## 2019-01-23 NOTE — Patient Instructions (Signed)

## 2019-01-23 NOTE — Progress Notes (Signed)
Virtual Visit via Video Note  I connected with [name] on 01/24/19 at 10:00 AM EDT by a video enabled telemedicine application and verified that I am speaking with the correct person using two identifiers.  Location: Patient: home Provider: [facility]   I discussed the limitations of evaluation and management by telemedicine and the availability of in person appointments. The patient expressed understanding and agreed to proceed.  Mar Daring, PA-C   Patient: Daisy Foley Female    DOB: 1980-11-21   38 y.o.   MRN: [id number] Visit Date: 01/23/2019  Today's Provider: Mar Daring, PA-C   Chief Complaint  Patient presents with  . Virtual Visit    Shoulder Pain   Subjective:     HPI  Patient is doing a virtual visit with provider today. She is having another flare up of her right shoulder. She has been seen before for trigger point of shoulder region,right. She has been treated with flexeril and Naproxen BID at home. Has done heat and massage. She has done PT. Reports massage therapy was working the best but she has been unable to get a massage since the stay-at-home orders began and the right shoulder started to flare up again. 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  No Known Allergies   Current Outpatient Medications:  .  acyclovir (ZOVIRAX) 400 MG tablet, TAKE 1 TABLET(400 MG) BY MOUTH TWICE DAILY, Disp: 60 tablet, Rfl: 5 .  cyclobenzaprine (FLEXERIL) 5 MG tablet, Take 1 tablet (5 mg total) by mouth 3 (three) times daily as needed for muscle spasms., Disp: 30 tablet, Rfl: 1 .  fluticasone (FLONASE) 50 MCG/ACT nasal spray, Place 2 sprays into both nostrils daily., Disp: 16 g, Rfl: 0 .  naproxen (NAPROSYN) 500 MG tablet, Take 1 tablet (500 mg total) by mouth 2 (two) times daily with a meal., Disp: 60 tablet, Rfl: 0 .  mometasone (ELOCON) 0.1 % cream, Apply 1 application  topically daily., Disp: , Rfl:   Review of Systems  Constitutional: Negative.   Respiratory: Negative.   Cardiovascular: Negative.   Musculoskeletal: Positive for arthralgias and myalgias.  Neurological: Negative.  Negative for weakness and numbness.    Social History   Tobacco Use  . Smoking status: Never Smoker  . Smokeless tobacco: Never Used  Substance Use Topics  . Alcohol use: Yes    Alcohol/week: 0.0 standard drinks    Comment: occas      Objective:   Temp 98.2 F (36.8 C) (Oral)   Wt 230 lb (104.3 kg)   BMI 31.19 kg/m  Vitals:   01/23/19 0856  Temp: 98.2 F (36.8 C)  TempSrc: Oral  Weight: 230 lb (104.3 kg)     Physical Exam Vitals signs reviewed.  Constitutional:      General: She is not in acute distress.    Appearance: Normal appearance. She is well-developed. She is not ill-appearing.  HENT:     Head: Normocephalic and atraumatic.  Neck:     Musculoskeletal: Normal range of motion and neck supple.  Pulmonary:     Effort: Pulmonary effort is normal. No respiratory distress.  Neurological:     Mental Status: She is alert.  Psychiatric:        Mood and Affect: Mood normal.        Behavior: Behavior normal.        Thought Content: Thought content normal.  Judgment: Judgment normal.         Assessment & Plan    1. Neck pain Will get imaging as below to r/o other bony abnormality. May possibly need an MRI in the future as she is having some shooting and burning pains in the area of the right upper trapezius. Does not radiate down the arm to the hand. Will use medrol dose pak for inflammation. Gabapentin for nerve irritation. Flexeril refilled. Work note provided. I will f/u once results received.  - DG Cervical Spine Complete; Future - DG Shoulder Right; Future - gabapentin (NEURONTIN) 100 MG capsule; Take 1 capsule (100 mg total) by mouth 3 (three) times daily.  Dispense: 90 capsule; Refill: 0 - methylPREDNISolone (MEDROL) 4 MG TBPK  tablet; 6 day taper; take as directed on package instructions  Dispense: 21 tablet; Refill: 0  2. Chronic right shoulder pain See above medical treatment plan. - DG Cervical Spine Complete; Future - DG Shoulder Right; Future - gabapentin (NEURONTIN) 100 MG capsule; Take 1 capsule (100 mg total) by mouth 3 (three) times daily.  Dispense: 90 capsule; Refill: 0 - methylPREDNISolone (MEDROL) 4 MG TBPK tablet; 6 day taper; take as directed on package instructions  Dispense: 21 tablet; Refill: 0  3. Trigger point of right shoulder region See above medical treatment plan. - gabapentin (NEURONTIN) 100 MG capsule; Take 1 capsule (100 mg total) by mouth 3 (three) times daily.  Dispense: 90 capsule; Refill: 0 - methylPREDNISolone (MEDROL) 4 MG TBPK tablet; 6 day taper; take as directed on package instructions  Dispense: 21 tablet; Refill: 0 - cyclobenzaprine (FLEXERIL) 5 MG tablet; Take 1 tablet (5 mg total) by mouth 3 (three) times daily as needed for muscle spasms.  Dispense: 30 tablet; Refill: 1    I discussed the assessment and treatment plan with the patient. The patient was provided an opportunity to ask questions and all were answered. The patient agreed with the plan and demonstrated an understanding of the instructions.   The patient was advised to call back or seek an in-person evaluation if the symptoms worsen or if the condition fails to improve as anticipated.  I provided 16 minutes of non-face-to-face time during this encounter.   Mar Daring, PA-C  [facility]

## 2019-01-24 ENCOUNTER — Ambulatory Visit
Admission: RE | Admit: 2019-01-24 | Discharge: 2019-01-24 | Disposition: A | Discharge status: Home / Self Care | Payer: 59 | Attending: Physician Assistant | Admitting: Physician Assistant

## 2019-01-24 ENCOUNTER — Encounter: Payer: Self-pay | Admitting: Physician Assistant

## 2019-01-24 ENCOUNTER — Ambulatory Visit
Admission: RE | Admit: 2019-01-24 | Discharge: 2019-01-24 | Disposition: A | Discharge status: Home / Self Care | Payer: 59 | Source: Ambulatory Visit | Attending: Physician Assistant | Admitting: Physician Assistant

## 2019-01-24 ENCOUNTER — Other Ambulatory Visit: Payer: Self-pay

## 2019-01-24 DIAGNOSIS — M542 Cervicalgia: Secondary | ICD-10-CM | POA: Diagnosis present

## 2019-01-24 DIAGNOSIS — M25511 Pain in right shoulder: Secondary | ICD-10-CM | POA: Diagnosis not present

## 2019-01-24 DIAGNOSIS — G8929 Other chronic pain: Secondary | ICD-10-CM | POA: Diagnosis not present

## 2019-01-25 ENCOUNTER — Telehealth: Payer: Self-pay

## 2019-01-25 NOTE — Telephone Encounter (Signed)
-----   Message from Mar Daring, [place] sent at 01/25/2019 10:35 AM EDT ----- Shoulder xray is also normal. If you would like to try to get the MRI let me know and I will order

## 2019-01-25 NOTE — Telephone Encounter (Signed)
Viewed by [name] on 01/25/2019 10:38 AM

## 2019-01-25 NOTE — Telephone Encounter (Signed)
-----   Message from Mar Daring, [place] sent at 01/25/2019 10:34 AM EDT ----- Neck xray is unremarkable

## 2019-01-25 NOTE — Telephone Encounter (Signed)
Viewed by [name] on 01/25/2019 10:37 AM  Written by Mar Daring, PA-C on 01/25/2019 10:35 AM  Shoulder xray is also normal. If you would like to try to get the MRI let me know and I will order

## 2019-02-18 ENCOUNTER — Encounter: Payer: Self-pay | Admitting: Physician Assistant

## 2019-02-18 ENCOUNTER — Telehealth: Payer: 59 | Admitting: Physician Assistant

## 2019-02-18 DIAGNOSIS — N760 Acute vaginitis: Secondary | ICD-10-CM

## 2019-02-18 DIAGNOSIS — B9689 Other specified bacterial agents as the cause of diseases classified elsewhere: Secondary | ICD-10-CM

## 2019-02-18 MED ORDER — FLUCONAZOLE 150 MG PO TABS: 150.0000 mg | ORAL_TABLET | Freq: Once | ORAL | 0 refills | Status: AC

## 2019-02-18 MED ORDER — METRONIDAZOLE 500 MG PO TABS: 500.0000 mg | ORAL_TABLET | Freq: Two times a day (BID) | ORAL | 0 refills | Status: DC

## 2019-02-18 NOTE — Progress Notes (Signed)

## 2019-02-18 NOTE — Addendum Note (Signed)
Addended by: Dutch Quint B on: 02/18/2019 04:36 PM   Modules accepted: Orders

## 2019-02-21 ENCOUNTER — Encounter: Payer: Self-pay | Admitting: Physician Assistant

## 2019-03-03 ENCOUNTER — Other Ambulatory Visit: Payer: Self-pay | Admitting: Physician Assistant

## 2019-03-03 ENCOUNTER — Other Ambulatory Visit: Payer: Self-pay | Admitting: Family Medicine

## 2019-03-03 DIAGNOSIS — G8929 Other chronic pain: Secondary | ICD-10-CM

## 2019-03-03 DIAGNOSIS — M542 Cervicalgia: Secondary | ICD-10-CM

## 2019-03-03 DIAGNOSIS — M25511 Pain in right shoulder: Secondary | ICD-10-CM

## 2019-03-03 DIAGNOSIS — M79672 Pain in left foot: Secondary | ICD-10-CM

## 2019-03-12 ENCOUNTER — Encounter: Payer: Self-pay | Admitting: Physician Assistant

## 2019-03-13 ENCOUNTER — Telehealth: Payer: Self-pay

## 2019-03-13 DIAGNOSIS — G8929 Other chronic pain: Secondary | ICD-10-CM

## 2019-03-13 NOTE — Telephone Encounter (Signed)
Please review

## 2019-03-13 NOTE — Telephone Encounter (Signed)
Patient is requesting a MRI on right shoulder. She states she had a X-ray and was told MRI would be the next recommendation. CB# [phone]

## 2019-03-14 NOTE — Telephone Encounter (Signed)
MRI has been ordered

## 2019-03-14 NOTE — Telephone Encounter (Signed)
Patient advised as directed below. 

## 2019-03-27 ENCOUNTER — Telehealth: Payer: Self-pay

## 2019-03-27 NOTE — Telephone Encounter (Signed)
Please advise 

## 2019-03-27 NOTE — Telephone Encounter (Signed)
Pt called to check and see if Daisy Foley has received her short-term disability from [employer].  She states that [employer] has faxed the form three times and has not received the forms back.  They are having to deny her claim until they receive the forms.  Please call Daisy Foley back at [phone]   Thanks,   -Mickel Baas

## 2019-03-27 NOTE — Telephone Encounter (Signed)
Faxed today

## 2019-03-27 NOTE — Telephone Encounter (Signed)
I completed this morning and they should be faxed today

## 2019-04-01 ENCOUNTER — Other Ambulatory Visit: Payer: Self-pay

## 2019-04-01 ENCOUNTER — Ambulatory Visit
Admission: RE | Admit: 2019-04-01 | Discharge: 2019-04-01 | Disposition: A | Discharge status: Home / Self Care | Payer: 59 | Source: Ambulatory Visit | Attending: Physician Assistant | Admitting: Physician Assistant

## 2019-04-01 DIAGNOSIS — M25511 Pain in right shoulder: Secondary | ICD-10-CM | POA: Insufficient documentation

## 2019-04-01 DIAGNOSIS — G8929 Other chronic pain: Secondary | ICD-10-CM

## 2019-04-01 NOTE — Progress Notes (Signed)
Patient: Daisy Foley Female    DOB: 1981/04/18   38 y.o.   MRN: [id number] Visit Date: 04/02/2019  Today's Provider: Mar Daring, PA-C   Chief Complaint  Patient presents with  . MRI Results   Subjective:    I,Joseline E. Rosas,RMA am acting as a Education administrator for Mar Daring, PA-C.  HPI  Patient here today to discuss MRI results.   Patient has been having pain in the right shoulder region with trigger points since 08/2018. She has been managing with conservative measures of massage therapy, PT, heating and icing, stretches, and NSAIDs. She was continuing to have pain and flares thus an MRI was obtained. The MRI did show impingement with an acromion hook and tears in the supraspinatus and infraspinatus, none complete, and soft tissue tendinosis and inflammation. Incidentally noted on the MRI was a right axillary lymphadenopathy. Patient has not noticed. There is no family history of breast cancer or lymphoma.   No Known Allergies   Current Outpatient Medications:  .  acyclovir (ZOVIRAX) 400 MG tablet, TAKE 1 TABLET(400 MG) BY MOUTH TWICE DAILY, Disp: 60 tablet, Rfl: 5 .  cyclobenzaprine (FLEXERIL) 5 MG tablet, Take 1 tablet (5 mg total) by mouth 3 (three) times daily as needed for muscle spasms., Disp: 30 tablet, Rfl: 1 .  fluticasone (FLONASE) 50 MCG/ACT nasal spray, Place 2 sprays into both nostrils daily., Disp: 16 g, Rfl: 0 .  gabapentin (NEURONTIN) 100 MG capsule, TAKE 1 CAPSULE(100 MG) BY MOUTH THREE TIMES DAILY, Disp: 90 capsule, Rfl: 5 .  naproxen (NAPROSYN) 500 MG tablet, TAKE 1 TABLET(500 MG) BY MOUTH TWICE DAILY WITH A MEAL, Disp: 60 tablet, Rfl: 5  Review of Systems  Constitutional: Negative.   Respiratory: Negative.   Cardiovascular: Negative.   Musculoskeletal: Positive for arthralgias.  Neurological: Negative.     Social History   Tobacco Use  . Smoking status: Never Smoker  . Smokeless tobacco: Never Used  Substance Use Topics  .  Alcohol use: Yes    Alcohol/week: 0.0 standard drinks    Comment: occas      Objective:   BP (!) 135/93 (BP Location: Left Arm, Patient Position: Sitting, Cuff Size: Large)   Pulse 79   Temp 98.2 F (36.8 C) (Oral)   Resp 16   Wt 241 lb 3.2 oz (109.4 kg)   BMI 32.71 kg/m  Vitals:   04/02/19 1615  BP: (!) 135/93  Pulse: 79  Resp: 16  Temp: 98.2 F (36.8 C)  TempSrc: Oral  Weight: 241 lb 3.2 oz (109.4 kg)     Physical Exam Vitals signs reviewed.  Constitutional:      General: She is not in acute distress.    Appearance: Normal appearance. She is well-developed. She is not ill-appearing or diaphoretic.  Neck:     Musculoskeletal: Normal range of motion and neck supple.     Thyroid: No thyromegaly.     Vascular: No JVD.     Trachea: No tracheal deviation.  Cardiovascular:     Rate and Rhythm: Normal rate and regular rhythm.     Pulses: Normal pulses.     Heart sounds: Normal heart sounds. No murmur. No friction rub. No gallop.   Pulmonary:     Effort: Pulmonary effort is normal. No respiratory distress.     Breath sounds: Normal breath sounds. No wheezing or rales.  Chest:     Breasts:        Right: No mass,  nipple discharge, skin change or tenderness.        Left: No mass, nipple discharge, skin change or tenderness.    Lymphadenopathy:     Cervical: No cervical adenopathy.     Upper Body:     Right upper body: Axillary adenopathy present. No supraclavicular or pectoral adenopathy.     Left upper body: No supraclavicular, axillary or pectoral adenopathy.  Neurological:     Mental Status: She is alert.      No results found for any visits on 04/02/19.     Assessment & Plan    1. Lymphadenopathy, axillary Will check labs as below to lessen likelihood for lymphoma or rule that in. Will get imaging as below in 2 weeks and f/u pending results. Possibly only reactive to shoulder injury. I will f/u pending results and work up. If required will refer to  oncology.  - MM DIAG BREAST TOMO BILATERAL; Future - US BREAST LTD UNI RIGHT INC AXILLA; Future - CBC w/Diff/Platelet - Comprehensive Metabolic Panel (CMET) - Pathologist smear review  2. Tendinopathy of right rotator cuff MRI did confirm tendonopathy of the right rotator cuff. Patient desires work up for lymphadenopathy first. If normal and appears to be more reactive, will then proceed with orthopedic referral for the right shoulder. She is in agreement.   3. Abnormal prominence of acromion Source of impingement.      Mar Daring, PA-C  [facility]

## 2019-04-02 ENCOUNTER — Ambulatory Visit (INDEPENDENT_AMBULATORY_CARE_PROVIDER_SITE_OTHER): Payer: 59 | Admitting: Physician Assistant

## 2019-04-02 ENCOUNTER — Encounter: Payer: Self-pay | Admitting: Physician Assistant

## 2019-04-02 VITALS — BP 135/93 | HR 79 | Temp 98.2°F | Resp 16 | Wt 241.2 lb

## 2019-04-02 DIAGNOSIS — M67911 Unspecified disorder of synovium and tendon, right shoulder: Secondary | ICD-10-CM

## 2019-04-02 DIAGNOSIS — R590 Localized enlarged lymph nodes: Secondary | ICD-10-CM

## 2019-04-02 DIAGNOSIS — M898X1 Other specified disorders of bone, shoulder: Secondary | ICD-10-CM | POA: Diagnosis not present

## 2019-04-02 NOTE — Patient Instructions (Signed)

## 2019-04-03 ENCOUNTER — Ambulatory Visit: Payer: Self-pay | Admitting: Physician Assistant

## 2019-04-04 LAB — CBC WITH DIFFERENTIAL/PLATELET
Basophils Absolute: 0 10*3/uL (ref 0.0–0.2)
Basos: 1 %
EOS (ABSOLUTE): 0 10*3/uL (ref 0.0–0.4)
Eos: 1 %
Hematocrit: 42.4 % (ref 34.0–46.6)
Hemoglobin: 14.2 g/dL (ref 11.1–15.9)
Immature Grans (Abs): 0 10*3/uL (ref 0.0–0.1)
Immature Granulocytes: 0 %
Lymphocytes Absolute: 1 10*3/uL (ref 0.7–3.1)
Lymphs: 24 %
MCH: 29 pg (ref 26.6–33.0)
MCHC: 33.5 g/dL (ref 31.5–35.7)
MCV: 87 fL (ref 79–97)
Monocytes Absolute: 0.5 10*3/uL (ref 0.1–0.9)
Monocytes: 11 %
Neutrophils Absolute: 2.6 10*3/uL (ref 1.4–7.0)
Neutrophils: 63 %
Platelets: 217 10*3/uL (ref 150–450)
RBC: 4.89 x10E6/uL (ref 3.77–5.28)
RDW: 12.5 % (ref 11.7–15.4)
WBC: 4.1 10*3/uL (ref 3.4–10.8)

## 2019-04-04 LAB — COMPREHENSIVE METABOLIC PANEL
ALT: 9 IU/L (ref 0–32)
AST: 13 IU/L (ref 0–40)
Albumin/Globulin Ratio: 1.3 (ref 1.2–2.2)
Albumin: 4.5 g/dL (ref 3.8–4.8)
Alkaline Phosphatase: 71 IU/L (ref 39–117)
BUN/Creatinine Ratio: 11 (ref 9–23)
BUN: 8 mg/dL (ref 6–20)
Bilirubin Total: 0.4 mg/dL (ref 0.0–1.2)
CO2: 21 mmol/L (ref 20–29)
Calcium: 9.5 mg/dL (ref 8.7–10.2)
Chloride: 105 mmol/L (ref 96–106)
Creatinine, Ser: 0.73 mg/dL (ref 0.57–1.00)
GFR calc Af Amer: 123 mL/min/{1.73_m2} (ref >59)
GFR calc non Af Amer: 106 mL/min/{1.73_m2} (ref >59)
Globulin, Total: 3.4 g/dL (ref 1.5–4.5)
Glucose: 97 mg/dL (ref 65–99)
Potassium: 4.2 mmol/L (ref 3.5–5.2)
Sodium: 139 mmol/L (ref 134–144)
Total Protein: 7.9 g/dL (ref 6.0–8.5)

## 2019-04-05 LAB — PATHOLOGIST SMEAR REVIEW
Basophils Absolute: 0 10*3/uL (ref 0.0–0.2)
Basos: 1 %
EOS (ABSOLUTE): 0 10*3/uL (ref 0.0–0.4)
Eos: 1 %
Hematocrit: 42.2 % (ref 34.0–46.6)
Hemoglobin: 14.1 g/dL (ref 11.1–15.9)
Immature Grans (Abs): 0 10*3/uL (ref 0.0–0.1)
Immature Granulocytes: 0 %
Lymphocytes Absolute: 1 10*3/uL (ref 0.7–3.1)
Lymphs: 25 %
MCH: 29 pg (ref 26.6–33.0)
MCHC: 33.4 g/dL (ref 31.5–35.7)
MCV: 87 fL (ref 79–97)
Monocytes Absolute: 0.5 10*3/uL (ref 0.1–0.9)
Monocytes: 11 %
Neutrophils Absolute: 2.5 10*3/uL (ref 1.4–7.0)
Neutrophils: 62 %
Path Rev PLTs: NORMAL
Path Rev RBC: NORMAL
Path Rev WBC: NORMAL
Platelets: 220 10*3/uL (ref 150–450)
RBC: 4.87 x10E6/uL (ref 3.77–5.28)
RDW: 12.8 % (ref 11.7–15.4)
WBC: 4 10*3/uL (ref 3.4–10.8)

## 2019-04-08 ENCOUNTER — Telehealth: Payer: Self-pay

## 2019-04-08 NOTE — Telephone Encounter (Signed)
Patient has been advised. KW 

## 2019-04-08 NOTE — Telephone Encounter (Signed)
-----   Message from Mar Daring, [place] sent at 04/05/2019  4:54 PM EDT ----- Luckily all labs look reassuring at this time. Will await [place] and mammogram. I know they want to wait 2 weeks before the [place] to see if the lymph node goes down.

## 2019-04-16 NOTE — Progress Notes (Deleted)
       Patient: Daisy Foley Female    DOB: 05/13/1981   39 y.o.   MRN: [id number] Visit Date: 04/16/2019  Today's Provider: Mar Daring, PA-C   No chief complaint on file.  Subjective:     HPI Patient here for 2 weeks follow up Lymphadenopathy.  No Known Allergies   Current Outpatient Medications:  .  acyclovir (ZOVIRAX) 400 MG tablet, TAKE 1 TABLET(400 MG) BY MOUTH TWICE DAILY, Disp: 60 tablet, Rfl: 5 .  cyclobenzaprine (FLEXERIL) 5 MG tablet, Take 1 tablet (5 mg total) by mouth 3 (three) times daily as needed for muscle spasms., Disp: 30 tablet, Rfl: 1 .  fluticasone (FLONASE) 50 MCG/ACT nasal spray, Place 2 sprays into both nostrils daily., Disp: 16 g, Rfl: 0 .  gabapentin (NEURONTIN) 100 MG capsule, TAKE 1 CAPSULE(100 MG) BY MOUTH THREE TIMES DAILY, Disp: 90 capsule, Rfl: 5 .  naproxen (NAPROSYN) 500 MG tablet, TAKE 1 TABLET(500 MG) BY MOUTH TWICE DAILY WITH A MEAL, Disp: 60 tablet, Rfl: 5  Review of Systems  Social History   Tobacco Use  . Smoking status: Never Smoker  . Smokeless tobacco: Never Used  Substance Use Topics  . Alcohol use: Yes    Alcohol/week: 0.0 standard drinks    Comment: occas      Objective:   There were no vitals taken for this visit. There were no vitals filed for this visit.   Physical Exam   No results found for any visits on 04/17/19.     Assessment & [place], PA-C  [facility]

## 2019-04-17 ENCOUNTER — Ambulatory Visit: Payer: 59 | Admitting: Physician Assistant

## 2019-04-26 ENCOUNTER — Ambulatory Visit
Admission: RE | Admit: 2019-04-26 | Discharge: 2019-04-26 | Disposition: A | Discharge status: Home / Self Care | Payer: 59 | Source: Ambulatory Visit | Attending: Physician Assistant | Admitting: Physician Assistant

## 2019-04-26 DIAGNOSIS — R590 Localized enlarged lymph nodes: Secondary | ICD-10-CM

## 2019-04-29 ENCOUNTER — Other Ambulatory Visit: Payer: Self-pay | Admitting: Physician Assistant

## 2019-04-29 DIAGNOSIS — R928 Other abnormal and inconclusive findings on diagnostic imaging of breast: Secondary | ICD-10-CM

## 2019-05-01 ENCOUNTER — Ambulatory Visit
Admission: RE | Admit: 2019-05-01 | Discharge: 2019-05-01 | Disposition: A | Discharge status: Home / Self Care | Payer: 59 | Source: Ambulatory Visit | Attending: Physician Assistant | Admitting: Physician Assistant

## 2019-05-01 DIAGNOSIS — R928 Other abnormal and inconclusive findings on diagnostic imaging of breast: Secondary | ICD-10-CM | POA: Diagnosis not present

## 2019-05-01 HISTORY — PX: BREAST BIOPSY: SHX20

## 2019-05-02 LAB — SURGICAL PATHOLOGY

## 2019-05-06 ENCOUNTER — Telehealth: Payer: Self-pay | Admitting: Physician Assistant

## 2019-05-06 NOTE — Telephone Encounter (Signed)
Can we call patient and have her follow up on this. She can do an evisit if she prefers but may benefit from in office appt. I need to look at her skin.

## 2019-05-06 NOTE — Telephone Encounter (Signed)
Pt had a reactive lymph node biopsy on the 12th.  They want Tawanna Sat to follow up with the patient.  The report is in imaging so they said we should be able to see it.  CB#  [phone].  Vaughan Basta at [facility]

## 2019-05-06 NOTE — Telephone Encounter (Signed)
Left message to call back  

## 2019-05-07 NOTE — Progress Notes (Signed)
Patient: Daisy Foley Female    DOB: [birth year]   38 y.o.   MRN: [id number] Visit Date: 05/08/2019  Today's Provider: Mar Daring, PA-C   Chief Complaint  Patient presents with  . Follow-up    biopsy   Subjective:     HPI  Patient is here to follow up with provider to assess lymph node status per Dr. Dimas Aguas. Patient denies any skin lesions, rashes, or other dermatologic issue. Denies having cats, birds or reptiles in the home. She does have a dog. Denies gardening. No fever, chills, nausea, diarrhea. No clinical source.  SURGICAL PATHOLOGY Surgical Pathology  CASE: [phone]  PATIENT: [facility]  Surgical Pathology Report      SPECIMEN SUBMITTED:  A. Axilla, right   CLINICAL HISTORY:  Bilateral enlarged lymph nodes of unclear etiology, DDX reactive,  sarcoid, lymphoma, metastasis   PRE-OPERATIVE DIAGNOSIS:  None provided   POST-OPERATIVE DIAGNOSIS:  None provided.      DIAGNOSIS:  A. LYMPH NODE, RIGHT AXILLA; ULTRASOUND-GUIDED CORE BIOPSY:  - FEATURES CONSISTENT WITH REACTIVE LYMPHOID HYPERPLASIA, SEE COMMENT.   Comment:  Fragments of nodal tissue display follicular hyperplasia,  interfollicular/paracortical expansion with clusters of histiocytes, and  sinus histiocytosis. Pigmented histiocytes are common. No granulomatous  inflammation or birefringent material is present. No metastatic  carcinoma is seen in this sample, and there are no morphologic features  to suggest a lymphoid neoplasm.   Dermatopathic lymphadenopathy is favored. In the histologic differential  are toxoplasmosis and viral lymphadenopathy. Correlation with skin exam  and clinical history are recommended, with further serologic evaluation  for infectious disease as clinically indicated. Since this is a small  sample from a single lymph node, follow-up is recommended in case there  is a more serious process not represented in this material for which   rebiopsy or excision would need to be considered.   GROSS DESCRIPTION:  A. Labeled: Right axillary ultrasound guided biopsy  Received: In 2 containers (formalin and normal saline)  Tissue fragment(s): 6 cores  Size: Ranging from 1.0-1.4 cm in length and 0.1 cm in diameter each  Description: Partially hemorrhagic soft tissue cores  Note: Two touch prep slides performed and evaluated by Dr. Reuel Derby. No  action needed.  Entirely submitted in 6 cassettes for permanent histological  examination.    Final Diagnosis performed by Bryan Lemma, MD.  Electronically signed  05/02/2019 2:15:13PM  The electronic signature indicates that the named Attending Pathologist  has evaluated the specimen  Technical component performed at [facility], [address], [place],  [place] [address] Lab: [phone] Dir: Rush Farmer, MD, MMM Professional  component performed at [facility], [facility], [place], [place], [place] [address] Lab: [phone] Dir: Dellia Nims.  [place], MD      No Known Allergies   Current Outpatient Medications:  .  acyclovir (ZOVIRAX) 400 MG tablet, TAKE 1 TABLET(400 MG) BY MOUTH TWICE DAILY, Disp: 60 tablet, Rfl: 5 .  cyclobenzaprine (FLEXERIL) 5 MG tablet, Take 1 tablet (5 mg total) by mouth 3 (three) times daily as needed for muscle spasms., Disp: 30 tablet, Rfl: 1 .  fluticasone (FLONASE) 50 MCG/ACT nasal spray, Place 2 sprays into both nostrils daily., Disp: 16 g, Rfl: 0 .  gabapentin (NEURONTIN) 100 MG capsule, TAKE 1 CAPSULE(100 MG) BY MOUTH THREE TIMES DAILY, Disp: 90 capsule, Rfl: 5 .  naproxen (NAPROSYN) 500 MG tablet, TAKE 1 TABLET(500 MG) BY MOUTH TWICE DAILY WITH A MEAL, Disp: 60 tablet,  Rfl: 5  Review of Systems  Constitutional: Negative.   Respiratory: Negative.   Cardiovascular: Negative.   Gastrointestinal: Negative.   Musculoskeletal: Positive for arthralgias (right shoulder).  Skin: Negative.   Neurological: Positive for weakness  (right shoulder). Negative for dizziness, numbness and headaches.    Social History   Tobacco Use  . Smoking status: Never Smoker  . Smokeless tobacco: Never Used  Substance Use Topics  . Alcohol use: Yes    Alcohol/week: 0.0 standard drinks    Comment: occas      Objective:   BP 118/80 (BP Location: Left Arm, Patient Position: Sitting, Cuff Size: Normal)   Pulse 86   Temp (!) 97.3 F (36.3 C) (Other (Comment)) Comment (Src): forehead  Resp 16   Wt 244 lb (110.7 kg)   SpO2 97%   BMI 33.09 kg/m  Vitals:   05/08/19 0820  BP: 118/80  Pulse: 86  Resp: 16  Temp: (!) 97.3 F (36.3 C)  TempSrc: Other (Comment)  SpO2: 97%  Weight: 244 lb (110.7 kg)     Physical Exam Vitals signs reviewed.  Constitutional:      General: She is not in acute distress.    Appearance: Normal appearance. She is well-developed. She is not ill-appearing or diaphoretic.  Neck:     Musculoskeletal: Normal range of motion and neck supple.     Thyroid: No thyromegaly.     Vascular: No JVD.     Trachea: No tracheal deviation.  Cardiovascular:     Rate and Rhythm: Normal rate and regular rhythm.     Pulses: Normal pulses.     Heart sounds: Normal heart sounds. No murmur. No friction rub. No gallop.   Pulmonary:     Effort: Pulmonary effort is normal. No respiratory distress.     Breath sounds: Normal breath sounds. No wheezing or rales.  Musculoskeletal:     Right shoulder: She exhibits decreased range of motion, tenderness, pain and decreased strength. She exhibits no effusion, no crepitus, no spasm and normal pulse.  Lymphadenopathy:     Cervical: No cervical adenopathy.     Upper Body:     Right upper body: Axillary adenopathy (still swollen and has small scar healing from recent biopsy) present.  Skin:    General: Skin is warm and dry.     Capillary Refill: Capillary refill takes less than 2 seconds.     Findings: No erythema, lesion or rash.  Neurological:     Mental Status: She is  alert.   CLINICAL DATA:  Right shoulder pain since November 2019.  EXAM: MRI OF THE RIGHT SHOULDER WITHOUT CONTRAST  TECHNIQUE: Multiplanar, multisequence MR imaging of the shoulder was performed. No intravenous contrast was administered.  COMPARISON:  Radiographs 01/24/2019  FINDINGS: Rotator cuff: Significant supraspinatus and infraspinatus tendinopathy/tendinosis with interstitial tears. There are also shallow bursal and articular surface tears. No full-thickness retracted tear.  The subscapularis tendon is intact.  Muscles:  Normal  Biceps long head:  Intact  Acromioclavicular Joint: Minimal degenerative changes. Type 2 acromion. Fairly significant lateral downsloping of the acromion and mild undersurface spurring could contribute to bony impingement.  Glenohumeral Joint: Normal articular cartilage. No joint effusion or synovitis.  Labrum:  No labral tears.  Bones:  No acute bony findings.  Other: Mild subacromial/subdeltoid bursitis.  There are several enlarged right axillary lymph nodes which are somewhat worrisome. Recommend clinical correlation. Would certainly want to make sure the patient does not have right breast cancer or a lymphoproliferative  process such as lymphoma.  IMPRESSION: 1. Significant supraspinatus and infraspinatus tendinopathy for age with thinning of the tendons, interstitial tears and shallow bursal and articular surface tears. No full-thickness retracted tear. 2. Fairly significant lateral downsloping of a type 2 acromion with mild undersurface spurring appears to narrow the humeroacromial space and could contribute to impingement. 3. Intact long head biceps tendon and glenoid labrum. 4. Mild subacromial/subdeltoid bursitis. 5. **An incidental finding of potential clinical significance has been found. Right axillary adenopathy. Recommend clinical correlation. It certainly possible these could be  inflammatory/hyperplastic but would need to exclude the possibility of right breast cancer or lymphoproliferative disorder.**   Electronically Signed   By: Marijo Sanes M.D.   On: 04/01/2019 16:18   No results found for any visits on 05/08/19.     Assessment & Plan    1. Tendinopathy of right rotator cuff Worsening. Patient has had xray, MRI and failed PT. Will refer to orthopedics for further evaluation and surgical consideration. Patient agrees.  - Ambulatory referral to Orthopedic Surgery  2. Bursitis of right shoulder See above medical treatment plan. - Ambulatory referral to Orthopedic Surgery  3. Tendinopathy of right biceps tendon See above medical treatment plan. - Ambulatory referral to Orthopedic Surgery  4. Lymphadenopathy, axillary Work up completely normal. Normal mammogram, labs unremarkable. Biopsy as noted above. No clinical correlation found. Possible reactive due to shoulder pathology above. Will continue to monitor and she is to call if any concerning symptoms develop.     Mar Daring, PA-C  [facility]

## 2019-05-08 ENCOUNTER — Ambulatory Visit (INDEPENDENT_AMBULATORY_CARE_PROVIDER_SITE_OTHER): Payer: 59 | Admitting: Physician Assistant

## 2019-05-08 ENCOUNTER — Encounter: Payer: Self-pay | Admitting: Physician Assistant

## 2019-05-08 ENCOUNTER — Other Ambulatory Visit: Payer: Self-pay

## 2019-05-08 VITALS — BP 118/80 | HR 86 | Temp 97.3°F | Resp 16 | Wt 244.0 lb

## 2019-05-08 DIAGNOSIS — M7551 Bursitis of right shoulder: Secondary | ICD-10-CM | POA: Diagnosis not present

## 2019-05-08 DIAGNOSIS — R590 Localized enlarged lymph nodes: Secondary | ICD-10-CM

## 2019-05-08 DIAGNOSIS — M67911 Unspecified disorder of synovium and tendon, right shoulder: Secondary | ICD-10-CM | POA: Diagnosis not present

## 2019-05-08 DIAGNOSIS — M67921 Unspecified disorder of synovium and tendon, right upper arm: Secondary | ICD-10-CM | POA: Diagnosis not present

## 2019-06-17 ENCOUNTER — Telehealth: Payer: 59 | Admitting: Physician Assistant

## 2019-06-17 DIAGNOSIS — B373 Candidiasis of vulva and vagina: Secondary | ICD-10-CM

## 2019-06-17 DIAGNOSIS — B3731 Acute candidiasis of vulva and vagina: Secondary | ICD-10-CM

## 2019-06-17 MED ORDER — FLUCONAZOLE 150 MG PO TABS: 150.0000 mg | ORAL_TABLET | Freq: Once | ORAL | 0 refills | Status: AC

## 2019-06-17 NOTE — Progress Notes (Signed)
We are sorry that you are not feeling well. Here is how we plan to help! Based on what you shared with me it looks like you: May have a yeast vaginosis  Vaginosis is an inflammation of the vagina that can result in discharge, itching and pain. The cause is usually a change in the normal balance of vaginal bacteria or an infection. Vaginosis can also result from reduced estrogen levels after menopause.  The most common causes of vaginosis are:   Bacterial vaginosis which results from an overgrowth of one on several organisms that are normally present in your vagina.   Yeast infections which are caused by a naturally occurring fungus called candida.   Vaginal atrophy (atrophic vaginosis) which results from the thinning of the vagina from reduced estrogen levels after menopause.   Trichomoniasis which is caused by a parasite and is commonly transmitted by sexual intercourse.  Factors that increase your risk of developing vaginosis include: . Medications, such as antibiotics and steroids . Uncontrolled diabetes . Use of hygiene products such as bubble bath, vaginal spray or vaginal deodorant . Douching . Wearing damp or tight-fitting clothing . Using an intrauterine device (IUD) for birth control . Hormonal changes, such as those associated with pregnancy, birth control pills or menopause . Sexual activity . Having a sexually transmitted infection  Your treatment plan is A single Diflucan (fluconazole) 150mg tablet once.  I have electronically sent this prescription into the pharmacy that you have chosen.  Be sure to take all of the medication as directed. Stop taking any medication if you develop a rash, tongue swelling or shortness of breath. Mothers who are breast feeding should consider pumping and discarding their breast milk while on these antibiotics. However, there is no consensus that infant exposure at these doses would be harmful.  Remember that medication creams can weaken latex  condoms. .   HOME CARE:  Good hygiene may prevent some types of vaginosis from recurring and may relieve some symptoms:  . Avoid baths, hot tubs and whirlpool spas. Rinse soap from your outer genital area after a shower, and dry the area well to prevent irritation. Don't use scented or harsh soaps, such as those with deodorant or antibacterial action. . Avoid irritants. These include scented tampons and pads. . Wipe from front to back after using the toilet. Doing so avoids spreading fecal bacteria to your vagina.  Other things that may help prevent vaginosis include:  . Don't douche. Your vagina doesn't require cleansing other than normal bathing. Repetitive douching disrupts the normal organisms that reside in the vagina and can actually increase your risk of vaginal infection. Douching won't clear up a vaginal infection. . Use a latex condom. Both female and female latex condoms may help you avoid infections spread by sexual contact. . Wear cotton underwear. Also wear pantyhose with a cotton crotch. If you feel comfortable without it, skip wearing underwear to bed. Yeast thrives in moist environments Your symptoms should improve in the next day or two.  GET HELP RIGHT AWAY IF:  . You have pain in your lower abdomen ( pelvic area or over your ovaries) . You develop nausea or vomiting . You develop a fever . Your discharge changes or worsens . You have persistent pain with intercourse . You develop shortness of breath, a rapid pulse, or you faint.  These symptoms could be signs of problems or infections that need to be evaluated by a medical provider now.  MAKE SURE YOU      Understand these instructions.  Will watch your condition.  Will get help right away if you are not doing well or get worse.  Your e-visit answers were reviewed by a board certified advanced clinical practitioner to complete your personal care plan. Depending upon the condition, your plan could have included  both over the counter or prescription medications. Please review your pharmacy choice to make sure that you have choses a pharmacy that is open for you to pick up any needed prescription, Your safety is important to us. If you have drug allergies check your prescription carefully.   You can use MyChart to ask questions about today's visit, request a non-urgent call back, or ask for a work or school excuse for 24 hours related to this e-Visit. If it has been greater than 24 hours you will need to follow up with your provider, or enter a new e-Visit to address those concerns. You will get a MyChart message within the next two days asking about your experience. I hope that your e-visit has been valuable and will speed your recovery.  Greater than 5 minutes, yet less than 10 minutes of time have been spent researching, coordinating and implementing care for this patient today.   

## 2019-07-11 ENCOUNTER — Encounter: Payer: Self-pay | Admitting: Physician Assistant

## 2019-07-11 ENCOUNTER — Ambulatory Visit (INDEPENDENT_AMBULATORY_CARE_PROVIDER_SITE_OTHER): Payer: 59 | Admitting: Physician Assistant

## 2019-07-11 ENCOUNTER — Other Ambulatory Visit: Payer: Self-pay

## 2019-07-11 VITALS — BP 134/82 | HR 98 | Temp 97.7°F | Resp 16 | Wt 241.0 lb

## 2019-07-11 DIAGNOSIS — K5900 Constipation, unspecified: Secondary | ICD-10-CM

## 2019-07-11 DIAGNOSIS — Z23 Encounter for immunization: Secondary | ICD-10-CM

## 2019-07-11 MED ORDER — MAGNESIUM CITRATE PO SOLN: 1.0000 | Freq: Once | ORAL | 0 refills | Status: AC

## 2019-07-11 NOTE — Addendum Note (Signed)
Addended by: Randal Buba on: 07/11/2019 01:59 PM   Modules accepted: Orders

## 2019-07-11 NOTE — Patient Instructions (Signed)

## 2019-07-11 NOTE — Progress Notes (Signed)
Patient: Daisy Foley Female    DOB: November 30, 1980   38 y.o.   MRN: [id number] Visit Date: 07/11/2019  Today's Provider: Trinna Post, PA-C   Chief Complaint  Patient presents with  . Constipation    x 1 week   Subjective:    Last bowel movement this morning which was purely liquid and then prior to that had a bowel movement over the weekend that she had to strain for and was not much. She normally has a bowel movement every day. She is able to pass gas.   She took her sons leftover linzess and had some watery bowel movements.   Constipation This is a new problem. Episode onset: 1 week ago. The problem is unchanged. Associated symptoms include abdominal pain and diarrhea. Pertinent negatives include no fever, nausea or vomiting. Treatments tried: tea, stool softerner, and Linzess. The treatment provided mild (causes liquid diarrhea) relief.  Patient states she had a cervical epidural on 07/02/2019. Days later she notice that she was having issues with constipation.   No Known Allergies   Current Outpatient Medications:  .  acyclovir (ZOVIRAX) 400 MG tablet, TAKE 1 TABLET(400 MG) BY MOUTH TWICE DAILY, Disp: 60 tablet, Rfl: 5 .  cyclobenzaprine (FLEXERIL) 5 MG tablet, Take 1 tablet (5 mg total) by mouth 3 (three) times daily as needed for muscle spasms., Disp: 30 tablet, Rfl: 1 .  fluticasone (FLONASE) 50 MCG/ACT nasal spray, Place 2 sprays into both nostrils daily., Disp: 16 g, Rfl: 0 .  gabapentin (NEURONTIN) 100 MG capsule, TAKE 1 CAPSULE(100 MG) BY MOUTH THREE TIMES DAILY, Disp: 90 capsule, Rfl: 5 .  naproxen (NAPROSYN) 500 MG tablet, TAKE 1 TABLET(500 MG) BY MOUTH TWICE DAILY WITH A MEAL, Disp: 60 tablet, Rfl: 5 .  magnesium citrate SOLN, Take 296 mLs (1 Bottle total) by mouth once for 1 dose., Disp: 195 mL, Rfl: 0  Review of Systems  Constitutional: Negative for appetite change, chills, fatigue and fever.  Respiratory: Negative for chest tightness and shortness of  breath.   Cardiovascular: Negative for chest pain and palpitations.  Gastrointestinal: Positive for abdominal distention, abdominal pain, constipation and diarrhea. Negative for nausea and vomiting.  Neurological: Negative for dizziness and weakness.    Social History   Tobacco Use  . Smoking status: Never Smoker  . Smokeless tobacco: Never Used  Substance Use Topics  . Alcohol use: Yes    Alcohol/week: 0.0 standard drinks    Comment: occas      Objective:   BP 134/82 (BP Location: Right Arm, Patient Position: Sitting, Cuff Size: Large)   Pulse 98   Temp 97.7 F (36.5 C) (Temporal)   Resp 16   Wt 241 lb (109.3 kg)   SpO2 98% Comment: room air  BMI 32.69 kg/m  Vitals:   07/11/19 1328  BP: 134/82  Pulse: 98  Resp: 16  Temp: 97.7 F (36.5 C)  TempSrc: Temporal  SpO2: 98%  Weight: 241 lb (109.3 kg)  Body mass index is 32.69 kg/m.   Physical Exam Constitutional:      Appearance: Normal appearance.  Cardiovascular:     Rate and Rhythm: Normal rate and regular rhythm.     Heart sounds: Normal heart sounds.  Pulmonary:     Effort: Pulmonary effort is normal.     Breath sounds: Normal breath sounds.  Abdominal:     General: Bowel sounds are normal. There is no distension.     Palpations: Abdomen is soft.  Tenderness: There is no abdominal tenderness.  Skin:    General: Skin is warm and dry.  Neurological:     Mental Status: She is alert and oriented to person, place, and time.  Psychiatric:        Mood and Affect: Mood normal.        Behavior: Behavior normal.      No results found for any visits on 07/11/19.     Assessment & Plan    1. Constipation, unspecified constipation type  We will provide her with samples of 72 mcg Linzess that she can take daily since that has provided patient with a bowel movement. Will send in magnesium citrate just in case, she can start with half a bottle.   - magnesium citrate SOLN; Take 296 mLs (1 Bottle total) by  mouth once for 1 dose.  Dispense: 195 mL; Refill: 0  The entirety of the information documented in the History of Present Illness, Review of Systems and Physical Exam were personally obtained by me. Portions of this information were initially documented by Meyer Cory, CMA and reviewed by me for thoroughness and accuracy.       Trinna Post, PA-C  [facility]

## 2019-07-17 ENCOUNTER — Encounter: Payer: Self-pay | Admitting: Physician Assistant

## 2019-08-19 ENCOUNTER — Other Ambulatory Visit: Payer: Self-pay | Admitting: Surgical

## 2019-08-20 ENCOUNTER — Other Ambulatory Visit: Payer: Self-pay | Admitting: Physician Assistant

## 2019-08-20 DIAGNOSIS — M25511 Pain in right shoulder: Secondary | ICD-10-CM

## 2019-08-20 NOTE — Telephone Encounter (Signed)
Requested medication (s) are due for refill today: yes  Requested medication (s) are on the active medication list: yes  Last refill:  08/09/2019  Future visit scheduled: no  Notes to clinic:  Refill cannot be delegated    Requested Prescriptions  Pending Prescriptions Disp Refills   cyclobenzaprine (FLEXERIL) 5 MG tablet [Pharmacy Med Name: CYCLOBENZAPRINE 5MG  TABLETS] 30 tablet 1    Sig: TAKE 1 TABLET(5 MG) BY MOUTH THREE TIMES DAILY AS NEEDED FOR MUSCLE SPASMS     Not Delegated - Analgesics:  Muscle Relaxants Failed - 08/20/2019  3:22 AM      Failed - This refill cannot be delegated      Passed - Valid encounter within last 6 months    Recent Outpatient Visits          1 month ago Constipation, unspecified constipation type   [place], Wendee Beavers, PA-C   3 months ago Tendinopathy of right rotator cuff   [place], [place], [place]   4 months ago Lymphadenopathy, axillary   [place], [place], [place]   6 months ago Neck pain   [place], [place]   10 months ago Trigger point of shoulder region, right   [place], Clearnce Sorrel, [place]      Future Appointments            In 4 weeks Philip Aspen, CNM [facility]

## 2019-09-06 IMAGING — MR MRI OF THE RIGHT SHOULDER WITHOUT CONTRAST
4 of 5 series · 31 of 40 positions shown · non-contrast
Comparison: Radiographs 01/24/2019

CLINICAL DATA: Right shoulder pain since July 2018.

EXAM:
MRI OF THE RIGHT SHOULDER WITHOUT CONTRAST
TECHNIQUE: Multiplanar, multisequence MR imaging of the shoulder was performed.
No intravenous contrast was administered.

[Series 5: PD fat-sat · axial · right · 4.0mm · 0.55mm/px · z∈[-43,+87]mm · 8 of 28 slices shown (1 of 2)]
[im 1/28]
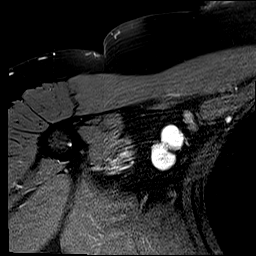
[im 4/28]
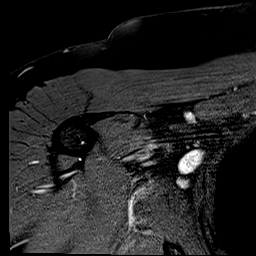
[im 10/28]
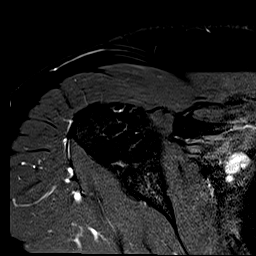
[im 13/28]
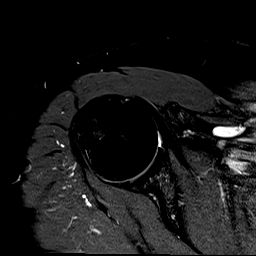
[im 16/28]
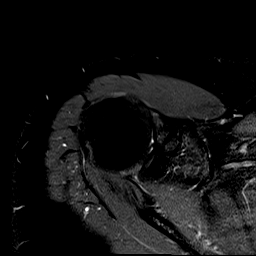
[im 19/28]
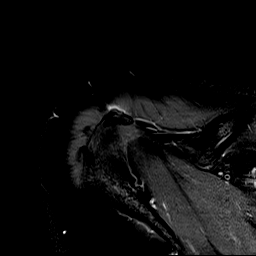
[im 25/28]
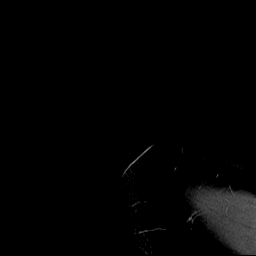
[im 28/28]
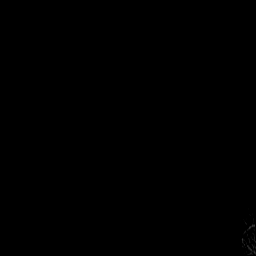

[Series 6: PD fat-sat · oblique · right · 4.0mm · 0.44mm/px · 8 of 26 slices shown (2 of 2)]
[im 1/26]
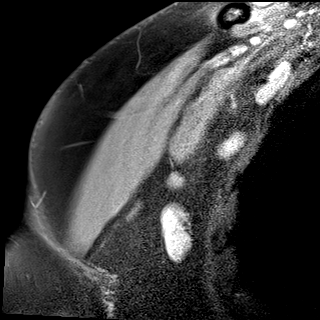
[im 4/26]
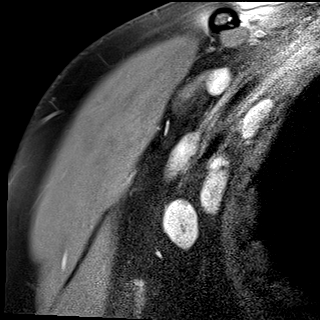
[im 8/26]
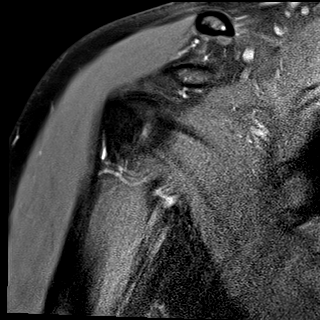
[im 11/26]
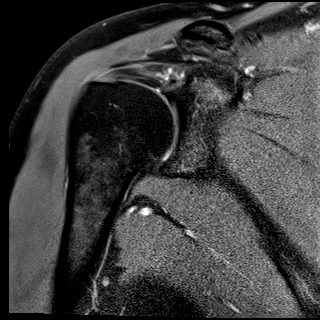
[im 15/26]
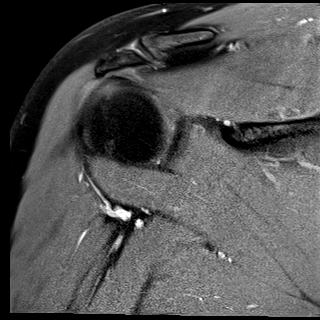
[im 18/26]
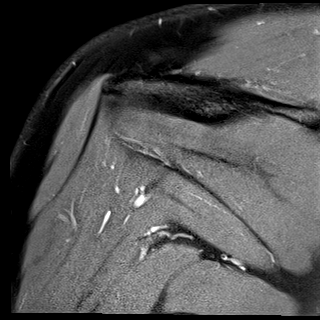
[im 22/26]
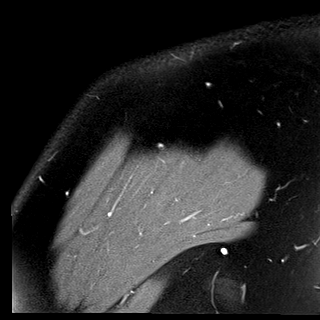
[im 26/26]
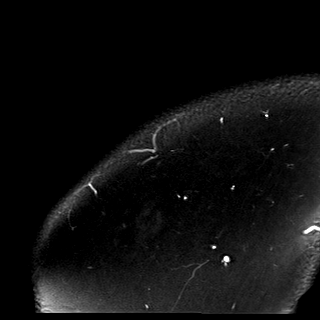

[Series 7: T2 fat-sat · oblique · right · 4.0mm · 0.44mm/px · 8 of 26 slices shown (1 of 2)]
[im 1/26]
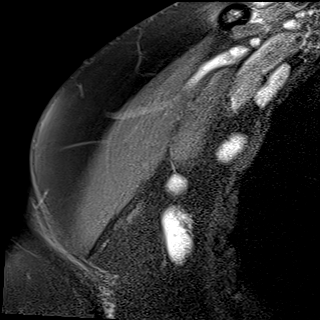
[im 4/26]
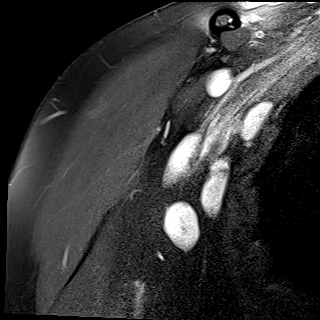
[im 8/26]
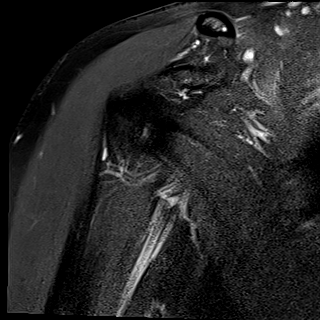
[im 11/26]
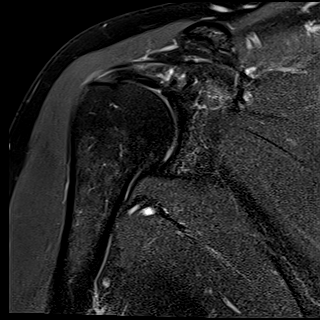
[im 15/26]
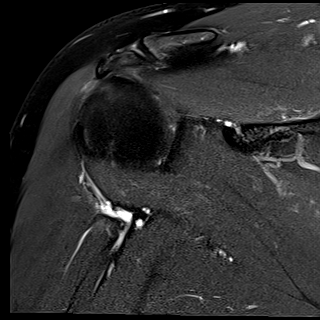
[im 18/26]
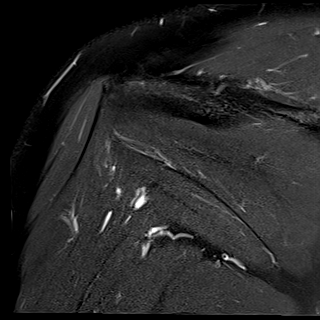
[im 22/26]
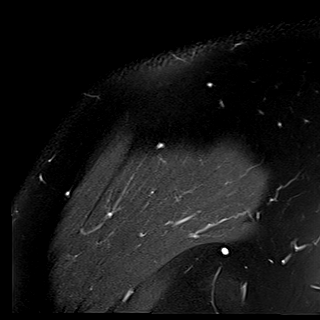
[im 26/26]
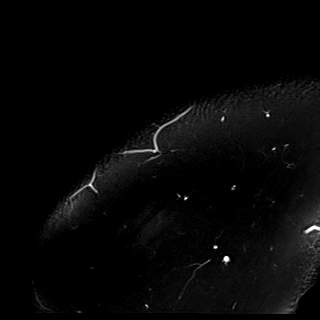

[Series 8: T2 fat-sat · oblique · right · 4.0mm · 0.23mm/px · 7 of 22 slices shown (2 of 2)]
[im 1/22]
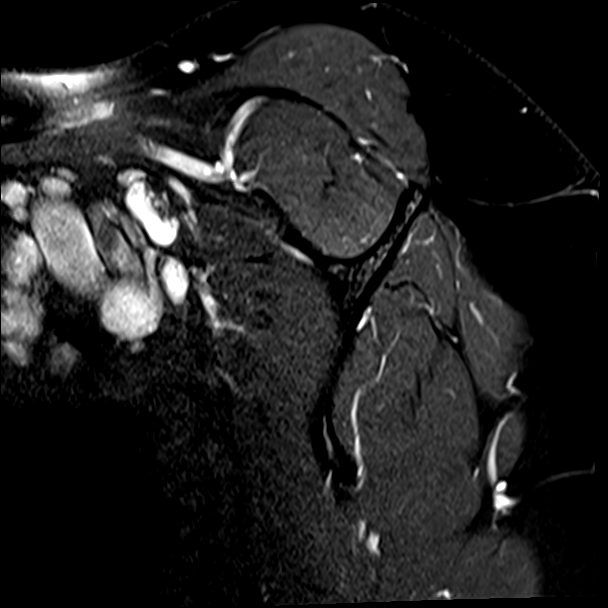
[im 4/22]
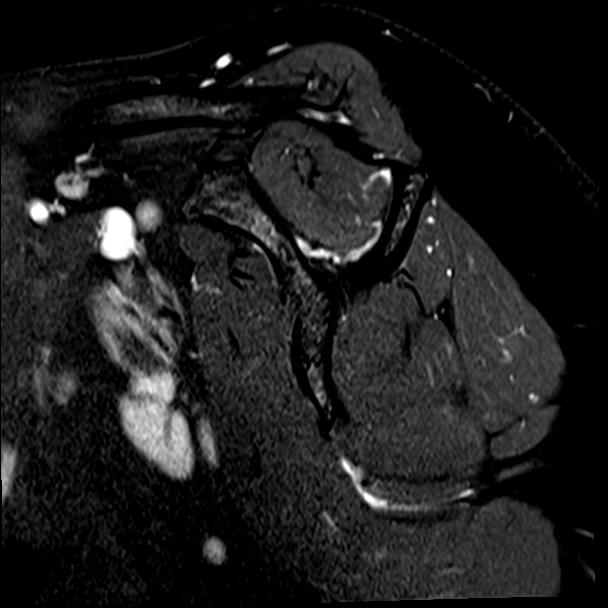
[im 8/22]
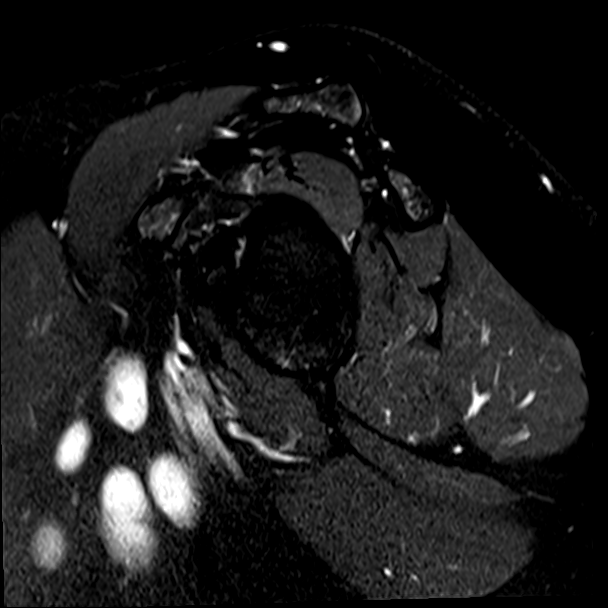
[im 11/22]
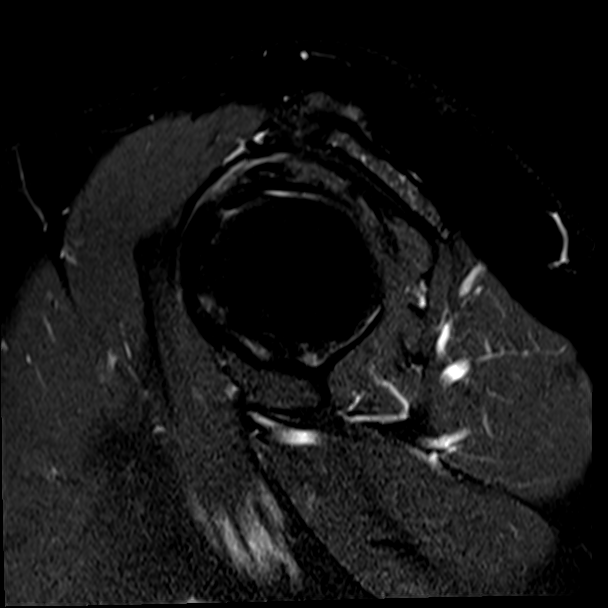
[im 15/22]
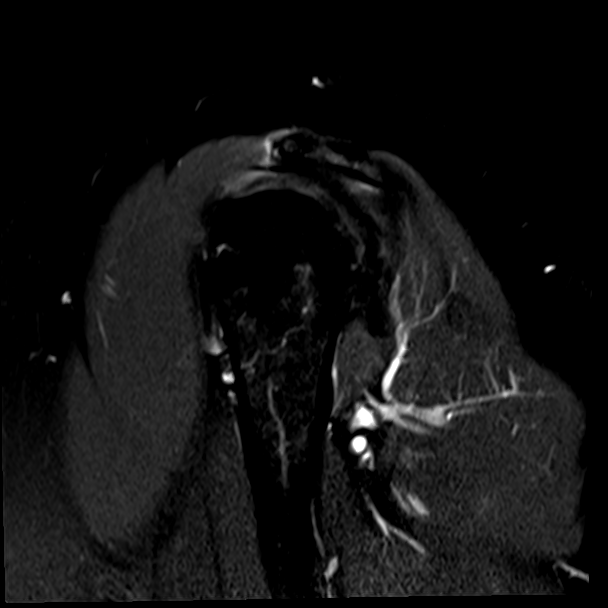
[im 18/22]
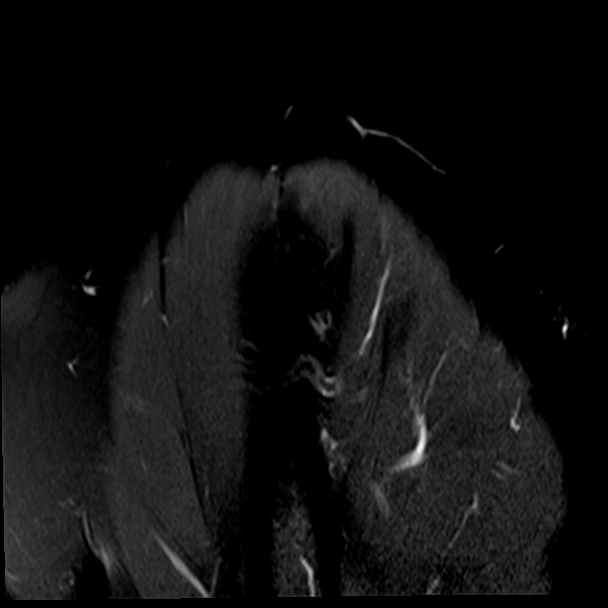
[im 22/22]
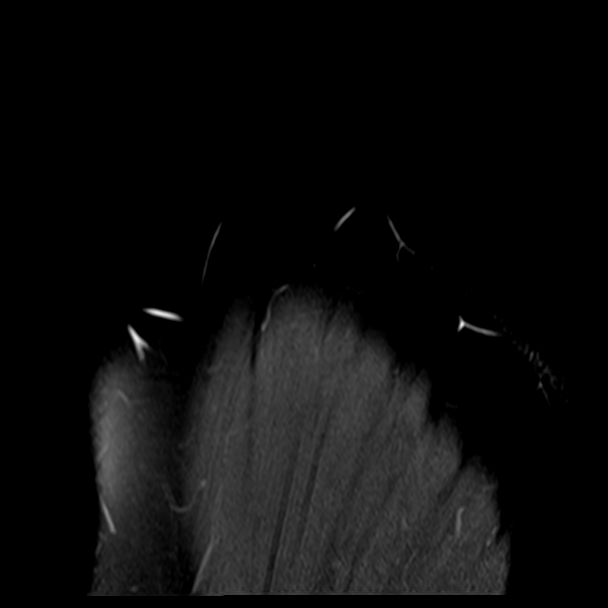

[31 of 40 positions shown; findings below may reference images not displayed]

FINDINGS: Rotator cuff: Significant supraspinatus and infraspinatus
tendinopathy/tendinosis with interstitial tears. There are also
shallow bursal and articular surface tears. No full-thickness
retracted tear.

The subscapularis tendon is intact.

Muscles:  Normal

Biceps long head:  Intact

Acromioclavicular Joint: Minimal degenerative changes. Type 2
acromion. Fairly significant lateral downsloping of the acromion and
mild undersurface spurring could contribute to bony impingement.

Glenohumeral Joint: Normal articular cartilage. No joint effusion or
synovitis.

Labrum:  No labral tears.

Bones:  No acute bony findings.

Other: Mild subacromial/subdeltoid bursitis.

There are several enlarged right axillary lymph nodes which are
somewhat worrisome. Recommend clinical correlation. Would certainly
want to make sure the patient does not have right breast cancer or a
lymphoproliferative process such as lymphoma.
IMPRESSION: 1. Significant supraspinatus and infraspinatus tendinopathy for age
with thinning of the tendons, interstitial tears and shallow bursal
and articular surface tears. No full-thickness retracted tear.
2. Fairly significant lateral downsloping of a type 2 acromion with
mild undersurface spurring appears to narrow the humeroacromial
space and could contribute to impingement.
3. Intact long head biceps tendon and glenoid labrum.
4. Mild subacromial/subdeltoid bursitis.
5. **An incidental finding of potential clinical significance has
been found. Right axillary adenopathy. Recommend clinical
correlation. It certainly possible these could be
inflammatory/hyperplastic but would need to exclude the possibility
of right breast cancer or lymphoproliferative disorder.**

## 2019-09-12 ENCOUNTER — Other Ambulatory Visit: Payer: Self-pay | Admitting: Physician Assistant

## 2019-09-12 DIAGNOSIS — M25511 Pain in right shoulder: Secondary | ICD-10-CM

## 2019-09-12 NOTE — Telephone Encounter (Signed)
Requested medication (s) are due for refill today: yes  Requested medication (s) are on the active medication list: yes  Last refill:  08/30/2019  Future visit scheduled: no  Notes to clinic: not delegated    Requested Prescriptions  Pending Prescriptions Disp Refills   cyclobenzaprine (FLEXERIL) 5 MG tablet [Pharmacy Med Name: CYCLOBENZAPRINE 5MG  TABLETS] 30 tablet 1    Sig: TAKE 1 TABLET(5 MG) BY MOUTH THREE TIMES DAILY AS NEEDED FOR MUSCLE SPASMS      Not Delegated - Analgesics:  Muscle Relaxants Failed - 09/12/2019  3:25 AM      Failed - This refill cannot be delegated      Passed - Valid encounter within last 6 months    Recent Outpatient Visits           2 months ago Constipation, unspecified constipation type   [place], Wendee Beavers, PA-C   4 months ago Tendinopathy of right rotator cuff   [place], [place], [place]   5 months ago Lymphadenopathy, axillary   [place], [place], [place]   7 months ago Neck pain   [place], [place]   11 months ago Trigger point of shoulder region, right   [place], Clearnce Sorrel, [place]       Future Appointments             In 1 month Philip Aspen, CNM [facility]

## 2019-09-18 ENCOUNTER — Encounter: Payer: 59 | Admitting: Certified Nurse Midwife

## 2019-10-15 ENCOUNTER — Encounter: Payer: 59 | Admitting: Certified Nurse Midwife

## 2019-12-27 ENCOUNTER — Ambulatory Visit: Payer: Self-pay | Attending: Internal Medicine

## 2019-12-27 DIAGNOSIS — Z23 Encounter for immunization: Secondary | ICD-10-CM

## 2019-12-27 NOTE — Progress Notes (Signed)
   Covid-19 Vaccination Clinic  Name:  Daisy Foley    MRN: [id number] DOB: Nov 16, 1980  12/27/2019  [name] was observed post Covid-19 immunization for 15 minutes without incident. She was provided with Vaccine Information Sheet and instruction to access the V-Safe system.   [name] was instructed to call 911 with any severe reactions post vaccine: Marland Kitchen Difficulty breathing  . Swelling of face and throat  . A fast heartbeat  . A bad rash all over body  . Dizziness and weakness   Immunizations Administered    Name Date Dose VIS Date Route   Pfizer COVID-19 Vaccine 12/27/2019  8:10 AM 0.3 mL 08/30/2019 Intramuscular   Manufacturer: [place]   Lot: [id number]   [place]: [id number]

## 2020-01-21 ENCOUNTER — Ambulatory Visit: Payer: Self-pay | Attending: Internal Medicine

## 2020-01-21 DIAGNOSIS — Z23 Encounter for immunization: Secondary | ICD-10-CM

## 2020-01-21 NOTE — Progress Notes (Signed)
   Covid-19 Vaccination Clinic  Name:  Daisy Foley    MRN: [id number] DOB: [birth year]  01/21/2020  [name] was observed post Covid-19 immunization for 15 minutes without incident. She was provided with Vaccine Information Sheet and instruction to access the V-Safe system.   [name] was instructed to call 911 with any severe reactions post vaccine: Marland Kitchen Difficulty breathing  . Swelling of face and throat  . A fast heartbeat  . A bad rash all over body  . Dizziness and weakness   Immunizations Administered    Name Date Dose VIS Date Route   Pfizer COVID-19 Vaccine 01/21/2020  4:02 PM 0.3 mL 11/13/2018 Intramuscular   Manufacturer: [place]   Lot: [id number]   [place]: [id number]

## 2020-02-04 ENCOUNTER — Ambulatory Visit: Payer: Self-pay | Admitting: Physician Assistant

## 2020-02-04 NOTE — Telephone Encounter (Signed)
Pt reports left ankle swelling, onset 2 months ago; happens intermittently after standing length of time, "Can't see ankle bone." Does not recall any injury.  States right ankle also swells but "Not as bad." Reports pain left heel and bottom of foot "Bad" with walking. States elevates feet, "Soaks them" swelling subsides but reoccurs as soon as standing/walking. Denies fever, no redness, no calf pain. Next available appt with PCP 02/14/2020, requesting earlier appt with Dr. Rosanna Randy. Appt made for 02/06/2020. Care advise given per protocol;pt verbalizes understanding.  Reason for Disposition . [1] MODERATE pain (e.g., interferes with normal activities, limping) AND [2] present > 3 days  Answer Assessment - Initial Assessment Questions 1. LOCATION: "Which joint is swollen?"    Left and right, left worse 2. ONSET: "When did the swelling start?"     2 months 3. SIZE: "How large is the swelling?"     Noticeable, "Can't see ankle bone."  4. PAIN: "Is there any pain?" If so, ask: "How bad is it?" (Scale 1-10; or mild, moderate, severe)     Left heel painful, stabbing pain, when standing, walking, bottom of foot 5. CAUSE: "What do you think caused the swollen joint?"     Unsure 6. OTHER SYMPTOMS: "Do you have any other symptoms?" (e.g., fever, chest pain, difficulty breathing, calf pain)     no  Protocols used: ANKLE SWELLING-A-AH

## 2020-02-05 NOTE — Progress Notes (Signed)
[name],acting as a scribe for [name], MD.,have documented all relevant documentation on the behalf of [name], MD,as directed by  [name], MD while in the presence of [name], MD.  Established patient visit   Patient: Daisy Foley   DOB: January 03, 1981   39 y.o. Female  MRN: [id number] Visit Date: 02/06/2020  Today's healthcare provider: [name], MD   Chief Complaint  Patient presents with  . Joint Swelling   Subjective    Foot Injury  Incident onset: 2 months ago. There was no injury mechanism. The pain is present in the left foot, left ankle, right foot, right ankle and left heel. The quality of the pain is described as stabbing. The pain is at a severity of 3/10. Associated symptoms include an inability to bear weight. She reports no foreign bodies present. The symptoms are aggravated by weight bearing. She has tried elevation, non-weight bearing and ice for the symptoms. The treatment provided no relief.   Patient thinks that she may have plantar fascitis because she has a stabbing pain In her heel mostly when walking.    First steps are the worst.  She works in [employer] now and she has to walk on a hard floor.  She no longer is able to do massage therapy because of cervical radiculopathy symptoms.     Medications: Outpatient Medications Prior to Visit  Medication Sig  . acyclovir (ZOVIRAX) 400 MG tablet TAKE 1 TABLET(400 MG) BY MOUTH TWICE DAILY  . cyclobenzaprine (FLEXERIL) 5 MG tablet TAKE 1 TABLET(5 MG) BY MOUTH THREE TIMES DAILY AS NEEDED FOR MUSCLE SPASMS  . fluticasone (FLONASE) 50 MCG/ACT nasal spray Place 2 sprays into both nostrils daily.  Marland Kitchen gabapentin (NEURONTIN) 100 MG capsule TAKE 1 CAPSULE(100 MG) BY MOUTH THREE TIMES DAILY  . naproxen (NAPROSYN) 500 MG tablet TAKE 1 TABLET(500 MG) BY MOUTH TWICE DAILY WITH A MEAL   No facility-administered medications prior to visit.    Review  of Systems  Constitutional: Negative.   Respiratory: Negative for chest tightness and shortness of breath.   Musculoskeletal: Positive for arthralgias and neck pain.  Allergic/Immunologic: Negative.   Psychiatric/Behavioral: Positive for dysphoric mood.       Objective    BP 120/81 (BP Location: Left Arm, Patient Position: Sitting, Cuff Size: Large)   Pulse 87   Temp 97.7 F (36.5 C) (Temporal)   Wt 250 lb (113.4 kg)   SpO2 98%   BMI 33.91 kg/m  Wt Readings from Last 3 Encounters:  02/06/20 250 lb (113.4 kg)  07/11/19 241 lb (109.3 kg)  05/08/19 244 lb (110.7 kg)      Physical Exam Vitals reviewed.  HENT:     Head: Normocephalic and atraumatic.     Right Ear: External ear normal.     Left Ear: External ear normal.  Eyes:     General: No scleral icterus.    Conjunctiva/sclera: Conjunctivae normal.  Cardiovascular:     Rate and Rhythm: Normal rate and regular rhythm.     Pulses: Normal pulses.     Heart sounds: Normal heart sounds.  Pulmonary:     Breath sounds: Normal breath sounds.  Abdominal:     Palpations: Abdomen is soft.  Musculoskeletal:        General: Tenderness present.     Comments: Tenderness at the plantar fascia insertion of the calcaneus.  Also little tenderness at the Achilles insertion of the calcaneus  Neurological:     General: No focal deficit present.     Mental Status: She is alert and oriented to person, place, and time.  Psychiatric:        Mood and Affect: Mood normal.        Behavior: Behavior normal.        Thought Content: Thought content normal.        Judgment: Judgment normal.       No results found for any visits on 02/06/20.  Assessment & Plan     1. Foot pain, left/plantar fasciitis. Have her wear supportive shoes.  Get some plantar stretching exercises from the Internet. - DG Ankle Complete Left - naproxen (NAPROSYN) 500 MG tablet; TAKE 1 TABLET(500 MG) BY MOUTH TWICE DAILY WITH A MEAL  Dispense: 60 tablet; Refill:  3   No follow-ups on file.      I, [name], MD, have reviewed all documentation for this visit. The documentation on 02/10/20 for the exam, diagnosis, procedures, and orders are all accurate and complete.    Brevin Mcfadden Cranford Mon, MD  [facility] [phone] (phone) [phone] (fax)  [place]

## 2020-02-06 ENCOUNTER — Other Ambulatory Visit: Payer: Self-pay

## 2020-02-06 ENCOUNTER — Ambulatory Visit
Admission: RE | Admit: 2020-02-06 | Discharge: 2020-02-06 | Disposition: A | Discharge status: Home / Self Care | Payer: BC Managed Care – PPO | Source: Ambulatory Visit | Attending: Family Medicine | Admitting: Family Medicine

## 2020-02-06 ENCOUNTER — Ambulatory Visit (INDEPENDENT_AMBULATORY_CARE_PROVIDER_SITE_OTHER): Payer: BC Managed Care – PPO | Admitting: Family Medicine

## 2020-02-06 DIAGNOSIS — M79672 Pain in left foot: Secondary | ICD-10-CM | POA: Insufficient documentation

## 2020-02-06 MED ORDER — NAPROXEN 500 MG PO TABS: ORAL_TABLET | ORAL | 3 refills | Status: DC

## 2020-02-10 ENCOUNTER — Telehealth: Payer: Self-pay

## 2020-02-10 NOTE — Telephone Encounter (Signed)
Patient given lab results through mychart and has read the providers comments.  

## 2020-02-10 NOTE — Telephone Encounter (Signed)
-----   Message from Jerrol Banana., MD sent at 02/07/2020 10:18 AM EDT ----- No or arthritic changes or fracture

## 2020-02-14 ENCOUNTER — Ambulatory Visit: Payer: Self-pay | Admitting: Physician Assistant

## 2020-02-16 ENCOUNTER — Other Ambulatory Visit: Payer: Self-pay | Admitting: Family Medicine

## 2020-02-16 NOTE — Telephone Encounter (Signed)
Requested Prescriptions  Pending Prescriptions Disp Refills  . acyclovir (ZOVIRAX) 400 MG tablet [Pharmacy Med Name: ACYCLOVIR 400MG  TABLETS] 90 tablet 3    Sig: TAKE 1 TABLET(400 MG) BY MOUTH TWICE DAILY     Antimicrobials:  Antiviral Agents - Anti-Herpetic Passed - 02/16/2020  3:29 AM      Passed - Valid encounter within last 12 months    Recent Outpatient Visits          1 week ago Foot pain, left   [facility] Jerrol Banana., MD   7 months ago Constipation, unspecified constipation type   [place], [place]   9 months ago Tendinopathy of right rotator cuff   [place], [place]   10 months ago Lymphadenopathy, axillary   [facility] [place], Clearnce Sorrel, [place]   1 year ago Neck pain   [place], Clearnce Sorrel, [place]      Future Appointments            In 3 weeks Jerrol Banana., MD [facility], [place]

## 2020-03-04 ENCOUNTER — Ambulatory Visit
Admission: EM | Admit: 2020-03-04 | Discharge: 2020-03-04 | Disposition: A | Discharge status: Home / Self Care | Payer: BC Managed Care – PPO | Attending: Internal Medicine | Admitting: Internal Medicine

## 2020-03-04 ENCOUNTER — Other Ambulatory Visit: Payer: Self-pay

## 2020-03-04 ENCOUNTER — Ambulatory Visit: Payer: Self-pay | Admitting: *Deleted

## 2020-03-04 ENCOUNTER — Other Ambulatory Visit: Payer: Self-pay | Admitting: Physician Assistant

## 2020-03-04 DIAGNOSIS — G8929 Other chronic pain: Secondary | ICD-10-CM

## 2020-03-04 DIAGNOSIS — S93602A Unspecified sprain of left foot, initial encounter: Secondary | ICD-10-CM

## 2020-03-04 DIAGNOSIS — M25511 Pain in right shoulder: Secondary | ICD-10-CM

## 2020-03-04 DIAGNOSIS — L03116 Cellulitis of left lower limb: Secondary | ICD-10-CM

## 2020-03-04 DIAGNOSIS — M542 Cervicalgia: Secondary | ICD-10-CM

## 2020-03-04 MED ORDER — DOXYCYCLINE MONOHYDRATE 100 MG PO TABS: 100.0000 mg | ORAL_TABLET | Freq: Two times a day (BID) | ORAL | 0 refills | Status: DC

## 2020-03-04 NOTE — Telephone Encounter (Signed)
Patient was seen in office recently and diagnosed with heel spur- her pain is worse and she has developed swelling and redness in the L foot/ankle area. She states she has a rash around the ankle. Advised per protocol- [facility]- she is going to go.( No appointment avalible in office)  Reason for Disposition . [1] SEVERE pain (e.g., excruciating, unable to do any normal activities) AND [2] not improved after 2 hours of pain medicine  Answer Assessment - Initial Assessment Questions 1. ONSET: "When did the pain start?"       diagnosed with heel spur- 3 months- getting worse 2. LOCATION: "Where is the pain located?"      L foot 3. PAIN: "How bad is the pain?"    (Scale 1-10; or mild, moderate, severe)   -  MILD (1-3): doesn't interfere with normal activities    -  MODERATE (4-7): interferes with normal activities (e.g., work or school) or awakens from sleep, limping    -  SEVERE (8-10): excruciating pain, unable to do any normal activities, unable to walk     10 4. WORK OR EXERCISE: "Has there been any recent work or exercise that involved this part of the body?"      no 5. CAUSE: "What do you think is causing the foot pain?"     Possible heel spur 6. OTHER SYMPTOMS: "Do you have any other symptoms?" (e.g., leg pain, rash, fever, numbness)     Rash/redness, swelling- coming up leg 7. PREGNANCY: "Is there any chance you are pregnant?" "When was your last menstrual period?"     n/a  Protocols used: FOOT PAIN-A-AH

## 2020-03-04 NOTE — ED Triage Notes (Signed)
Pt c/o left ankle pain x several months, saw PCP and was started on naproxen for "heel spurs," states swelling has increased to right lower leg with rash now. Pt states "I have a follow up appointment on 6/22, I'm just here today because I need a work to stay home from work until then."

## 2020-03-04 NOTE — Discharge Instructions (Signed)
If you develop calf pain and worse leg swelling please go to the ER.

## 2020-03-04 NOTE — ED Provider Notes (Signed)
[facility]    CSN: [id number] Arrival date & time: 03/04/20  1026      History   Chief Complaint Chief Complaint  Patient presents with  . Ankle Pain    HPI Daisy Foley is a 39 y.o. female. with new onset of redness on lateral L lower leg since yesterday, but those areas are not painful. Had neg xray when she saw her PCP due to persistent heel and ankle pain. Has been taking Naprosyn which is not helping and would like a note to be off til 6/22 at which time she has apt with her PCP. The areas of rendness does not hurt her. She denies feeling any stinging sensation on her lateral leg before she saw the redness.  Denies taking new meds or oral contraceptives   Past Medical History:  Diagnosis Date  . Endometriosis/adenomyosis   . Fibroid   . Genital herpes     Patient Active Problem List   Diagnosis Date Noted  . Foot trauma, left, subsequent encounter 05/23/2018  . Abnormal vaginal bleeding 01/09/2017  . STD exposure 01/09/2017  . ADD (attention deficit disorder) 06/29/2015  . Clinical depression 06/29/2015  . Esophagitis, reflux 06/29/2015  . Adult hypothyroidism 06/29/2015  . Headache, migraine 06/29/2015  . Pure hypercholesterolemia 06/29/2015  . Allergic rhinitis 07/16/2009  . Absolute anemia 07/16/2009  . Essential (primary) hypertension 07/16/2009  . Genital herpes 02/10/2009    Past Surgical History:  Procedure Laterality Date  . BREAST BIOPSY Right 05/01/2019   2 axilla LN bx path pending   . DILATION AND CURETTAGE OF UTERUS    . ENDOMETRIAL ABLATION    . Laparoscopic supracervical hysterectomy bilateral salpingectomy  2015   and BS- with [facility] - [facility]  . PELVIC LAPAROSCOPY      OB History    Gravida  3   Para  2   Term  2   Preterm      AB  1   Living  2     SAB      TAB  1   Ectopic      Multiple      Live Births  2            Home Medications    Prior to Admission medications   Medication Sig Start  Date End Date Taking? Authorizing Provider  acyclovir (ZOVIRAX) 400 MG tablet TAKE 1 TABLET(400 MG) BY MOUTH TWICE DAILY 02/16/20   Jerrol Banana., MD  cyclobenzaprine (FLEXERIL) 5 MG tablet TAKE 1 TABLET(5 MG) BY MOUTH THREE TIMES DAILY AS NEEDED FOR MUSCLE SPASMS 09/12/19   Mar Daring, PA-C  doxycycline (ADOXA) 100 MG tablet Take 1 tablet (100 mg total) by mouth 2 (two) times daily. 03/04/20   Rodriguez-Southworth, Sunday Spillers, PA-C  fluticasone (FLONASE) 50 MCG/ACT nasal spray Place 2 sprays into both nostrils daily. 04/17/18   Carmon Ginsberg, PA  gabapentin (NEURONTIN) 100 MG capsule TAKE 1 CAPSULE(100 MG) BY MOUTH THREE TIMES DAILY 03/04/19   Mar Daring, PA-C  naproxen (NAPROSYN) 500 MG tablet TAKE 1 TABLET(500 MG) BY MOUTH TWICE DAILY WITH A MEAL 02/06/20   Jerrol Banana., MD    Family History Family History  Problem Relation Age of Onset  . Hypertension Mother   . Depression Mother   . Bipolar disorder Mother   . Diabetes Mother   . Diabetes Maternal Aunt   . Diabetes Maternal Uncle   . Breast cancer Neg Hx   .  Ovarian cancer Neg Hx   . Colon cancer Neg Hx   . Heart disease Neg Hx     Social History Social History   Tobacco Use  . Smoking status: Never Smoker  . Smokeless tobacco: Never Used  Vaping Use  . Vaping Use: Never used  Substance Use Topics  . Alcohol use: Yes    Alcohol/week: 0.0 standard drinks    Comment: occas  . Drug use: No    Comment: marijuana use twice this past weekend     Allergies   Patient has no known allergies.   Review of Systems Review of Systems  Constitutional: Negative for chills, diaphoresis and fever.  Cardiovascular: Positive for leg swelling.  Musculoskeletal: Positive for gait problem. Negative for myalgias.       Denies calf pain. + for heel pain  Skin: Negative for rash and wound.  Hematological: Negative for adenopathy.     Physical Exam Triage Vital Signs ED Triage Vitals  Enc Vitals  Group     BP 03/04/20 1036 (!) 145/93     Pulse Rate 03/04/20 1035 75     Resp 03/04/20 1035 18     Temp 03/04/20 1035 98.7 F (37.1 C)     Temp src --      SpO2 03/04/20 1035 100 %     Weight --      Height --      Head Circumference --      Peak Flow --      Pain Score 03/04/20 1036 8     Pain Loc --      Pain Edu? --      Excl. in [place]? --    No data found.  Updated Vital Signs BP (!) 145/93   Pulse 75   Temp 98.7 F (37.1 C)   Resp 18   SpO2 100%   Visual Acuity Right Eye Distance:   Left Eye Distance:   Bilateral Distance:    Right Eye Near:   Left Eye Near:    Bilateral Near:     Physical Exam Vitals and nursing note reviewed.  Constitutional:      General: She is not in acute distress.    Appearance: She is not toxic-appearing.  HENT:     Head: Atraumatic.     Right Ear: External ear normal.     Left Ear: External ear normal.  Eyes:     General: No scleral icterus.    Conjunctiva/sclera: Conjunctivae normal.  Pulmonary:     Effort: Pulmonary effort is normal.  Musculoskeletal:        General: Normal range of motion.     Cervical back: Neck supple.     Comments: L  LEG- has pitting edema of L ankle +1/4 laterally  Skin:    General: Skin is warm and dry.     Findings: Erythema present. No bruising.     Comments: L LEG- measured at 43 1/2 cm, R was 41 1/2 cm. She does not had L calf tenderness or cords. Has faint redness on lateral lower leg, but those areas are not hot.  Her foot is normal, and does not have any open wounds. She does have  several superficial varicosities.  L FOOT- has very tender heel to palpation  Neurological:     Mental Status: She is alert and oriented to person, place, and time.  Psychiatric:        Mood and Affect: Mood normal.  Behavior: Behavior normal.        Thought Content: Thought content normal.        Judgment: Judgment normal.    UC Treatments / Results  Labs (all labs ordered are listed, but only  abnormal results are displayed) Labs Reviewed - No data to display  EKG   Radiology No results found.  Procedures Procedures (including critical care time)  Medications Ordered in UC Medications - No data to display  Initial Impression / Assessment and Plan / UC Course  I have reviewed the triage vital signs and the nursing notes. I am concerned of early cellulitis since the redness is new, so I placed her on Doxy as noted and told she should improve in 48-73h, but if she gets worse, with development or more swelling of her L lower leg and calf pain, to go to ER.  I also explained to her that I can only give her off for 3 days, otherwise FMLA kicks in. She understands.  Final Clinical Impressions(s) / UC Diagnoses   Final diagnoses:  Sprain of left foot, initial encounter  Cellulitis of leg, left     Discharge Instructions     If you develop calf pain and worse leg swelling please go to the ER.     ED Prescriptions    Medication Sig Dispense Auth. Provider   doxycycline (ADOXA) 100 MG tablet Take 1 tablet (100 mg total) by mouth 2 (two) times daily. 20 tablet Rodriguez-Southworth, Sunday Spillers, PA-C     PDMP not reviewed this encounter.   Shelby Mattocks, PA-C 03/04/20 1245

## 2020-03-09 NOTE — Progress Notes (Signed)
Established patient visit   Patient: Daisy Foley   DOB: 03-02-1981   39 y.o. Female  MRN: [id number] Visit Date: 03/10/2020  Today's healthcare provider: [name], MD   Chief Complaint  Patient presents with  . Depression  . Foot Pain   Subjective    HPI  Patient presents today for a 1 month follow up on depression and left foot pain. Patient says that she went to urgent care last Wednesday because her foot pain had gotten worse, she was taken out of work for a few days and was given an antibiotic (Doxycycline) and it has not helped. Patient says that today her foot is still hurting and it is mostly aggravated by her standing or walking.  She is better than last week but she still has pain with weightbearing.  Is able to bear weight but it hurts after a while.  Foot pain, left/plantar fasciitis. From 02/06/2020-Have her wear supportive shoes.  Get some plantar stretching exercises from the Internet.  She also is having some recurrent depression or anhedonia.  No suicidal or homicidal ideation.     Medications: Outpatient Medications Prior to Visit  Medication Sig  . acyclovir (ZOVIRAX) 400 MG tablet TAKE 1 TABLET(400 MG) BY MOUTH TWICE DAILY  . cyclobenzaprine (FLEXERIL) 5 MG tablet TAKE 1 TABLET(5 MG) BY MOUTH THREE TIMES DAILY AS NEEDED FOR MUSCLE SPASMS  . doxycycline (ADOXA) 100 MG tablet Take 1 tablet (100 mg total) by mouth 2 (two) times daily.  . fluticasone (FLONASE) 50 MCG/ACT nasal spray Place 2 sprays into both nostrils daily.  Marland Kitchen gabapentin (NEURONTIN) 100 MG capsule TAKE 1 CAPSULE(100 MG) BY MOUTH THREE TIMES DAILY  . naproxen (NAPROSYN) 500 MG tablet TAKE 1 TABLET(500 MG) BY MOUTH TWICE DAILY WITH A MEAL   No facility-administered medications prior to visit.    Review of Systems  Constitutional: Negative for appetite change, chills, fatigue and fever.  Eyes: Negative.   Respiratory: Negative for chest tightness and shortness of breath.     Cardiovascular: Negative for chest pain and palpitations.  Gastrointestinal: Negative for abdominal pain, nausea and vomiting.  Endocrine: Negative.   Musculoskeletal: Positive for arthralgias.  Allergic/Immunologic: Negative.   Neurological: Negative for dizziness and weakness.  Hematological: Negative.   Psychiatric/Behavioral: Positive for dysphoric mood.       Objective    BP 116/78 (BP Location: Left Arm, Patient Position: Sitting, Cuff Size: Large)   Pulse 76   Temp (!) 97.3 F (36.3 C) (Temporal)   Ht 6' (1.829 m)   Wt 254 lb 9.6 oz (115.5 kg)   BMI 34.53 kg/m  BP Readings from Last 3 Encounters:  03/10/20 116/78  03/04/20 (!) 145/93  02/06/20 120/81   Wt Readings from Last 3 Encounters:  03/10/20 254 lb 9.6 oz (115.5 kg)  02/06/20 250 lb (113.4 kg)  07/11/19 241 lb (109.3 kg)      Physical Exam Vitals reviewed.  HENT:     Head: Normocephalic and atraumatic.     Right Ear: External ear normal.     Left Ear: External ear normal.  Eyes:     General: No scleral icterus.    Conjunctiva/sclera: Conjunctivae normal.  Cardiovascular:     Rate and Rhythm: Normal rate and regular rhythm.     Pulses: Normal pulses.     Heart sounds: Normal heart sounds.  Pulmonary:     Breath sounds: Normal breath sounds.  Abdominal:     Palpations: Abdomen is soft.  Musculoskeletal:        General: Tenderness present.     Comments: Tenderness at the plantar fascia insertion of the calcaneus.  Mild swelling of the heel aspect of the ankle below the malleolus.  No skin changes today.  Neurological:     General: No focal deficit present.     Mental Status: She is alert and oriented to person, place, and time.  Psychiatric:        Behavior: Behavior normal.        Thought Content: Thought content normal.        Judgment: Judgment normal.       No results found for any visits on 03/10/20.  Assessment & Plan     1. Plantar fasciitis of left foot Due to the significant  discomfort she is having we will keep her out of work for the rest of this week.  Try Celebrex 200 mg daily wear good shoes stop naproxen and refer her to Dr. Caryl Comes from podiatry. - Ambulatory referral to Podiatry  2. Depression, unspecified depression type  - buPROPion (WELLBUTRIN XL) 150 MG 24 hr tablet; Take 1 tablet (150 mg total) by mouth daily.  Dispense: 30 tablet; Refill: 5  3. Depression, recurrent ([place]) This is a recurrent depression.  Consider counseling or consider psychiatric referral if she does not improve.  Hopefully the Wellbutrin will also help her lose some weight which would help.   No follow-ups on file.         Crispin Vogel Cranford Mon, MD  [facility] [phone] (phone) [phone] (fax)  [place]

## 2020-03-10 ENCOUNTER — Ambulatory Visit (INDEPENDENT_AMBULATORY_CARE_PROVIDER_SITE_OTHER): Payer: BC Managed Care – PPO | Admitting: Family Medicine

## 2020-03-10 ENCOUNTER — Other Ambulatory Visit: Payer: Self-pay

## 2020-03-10 ENCOUNTER — Encounter: Payer: Self-pay | Admitting: Family Medicine

## 2020-03-10 VITALS — BP 116/78 | HR 76 | Temp 97.3°F | Ht 72.0 in | Wt 254.6 lb

## 2020-03-10 DIAGNOSIS — M722 Plantar fascial fibromatosis: Secondary | ICD-10-CM

## 2020-03-10 DIAGNOSIS — F339 Major depressive disorder, recurrent, unspecified: Secondary | ICD-10-CM

## 2020-03-10 DIAGNOSIS — F329 Major depressive disorder, single episode, unspecified: Secondary | ICD-10-CM

## 2020-03-10 DIAGNOSIS — F32A Depression, unspecified: Secondary | ICD-10-CM

## 2020-03-10 MED ORDER — BUPROPION HCL ER (XL) 150 MG PO TB24: 150.0000 mg | ORAL_TABLET | Freq: Every day | ORAL | 5 refills | Status: DC

## 2020-03-10 NOTE — Patient Instructions (Signed)
STOP NAPROXEN!! WEAR SHOES WITH GOOD SUPPORT!!

## 2020-03-16 ENCOUNTER — Telehealth: Payer: Self-pay | Admitting: Physician Assistant

## 2020-03-16 DIAGNOSIS — M722 Plantar fascial fibromatosis: Secondary | ICD-10-CM

## 2020-03-16 MED ORDER — DOXYCYCLINE MONOHYDRATE 100 MG PO TABS: 100.0000 mg | ORAL_TABLET | Freq: Two times a day (BID) | ORAL | 0 refills | Status: DC

## 2020-03-16 NOTE — Telephone Encounter (Signed)
Okay to refill antibiotic that she just finished as it seems to help her with the redness and tenderness.  Okay to write a note to be out of work until her appointment with podiatry.  And working on the [facility] form.

## 2020-03-16 NOTE — Telephone Encounter (Signed)
pls reach back out to pt as she is having an issue with her Planter Fascitis again. She has seen Dr Darnell Level twice and even gotten a referral for podiatry, they called and she has an appt for 7/7. PF had improved a lot but over the weekend it is right back to being red and inflamed. She had a note from Dr Darnell Level to return to work today but states there is no way. Pt wants a nurse to FU with her on suggestions till podiatry appt and maybe extended dr note for work.CB [phone]

## 2020-03-16 NOTE — Telephone Encounter (Signed)
Patient advised.

## 2020-03-25 ENCOUNTER — Telehealth: Payer: Self-pay

## 2020-03-25 NOTE — Telephone Encounter (Signed)
Copied from [place] [id number]. Topic: General - Inquiry >> Mar 25, 2020 11:46 AM Alease Frame wrote: Reason for CRM: Pt called in states she's needing FMLA paperwork faxed back over to [employer] fax [phone]

## 2020-03-25 NOTE — Telephone Encounter (Signed)
Forms were faxed again to [facility]; [phone]. I left detailed VM advising pt forms had been faxed as requested. I also verified complete fax was successful. TNP

## 2020-04-09 ENCOUNTER — Other Ambulatory Visit (HOSPITAL_COMMUNITY)
Admission: RE | Admit: 2020-04-09 | Discharge: 2020-04-09 | Disposition: A | Discharge status: Home / Self Care | Payer: BC Managed Care – PPO | Source: Ambulatory Visit | Attending: Obstetrics and Gynecology | Admitting: Obstetrics and Gynecology

## 2020-04-09 ENCOUNTER — Ambulatory Visit (INDEPENDENT_AMBULATORY_CARE_PROVIDER_SITE_OTHER): Payer: BC Managed Care – PPO | Admitting: Obstetrics and Gynecology

## 2020-04-09 ENCOUNTER — Encounter: Payer: Self-pay | Admitting: Obstetrics and Gynecology

## 2020-04-09 ENCOUNTER — Encounter: Payer: BC Managed Care – PPO | Admitting: Obstetrics and Gynecology

## 2020-04-09 ENCOUNTER — Other Ambulatory Visit: Payer: Self-pay

## 2020-04-09 VITALS — BP 130/84 | HR 75 | Ht 72.0 in | Wt 246.6 lb

## 2020-04-09 DIAGNOSIS — Z124 Encounter for screening for malignant neoplasm of cervix: Secondary | ICD-10-CM

## 2020-04-09 DIAGNOSIS — N939 Abnormal uterine and vaginal bleeding, unspecified: Secondary | ICD-10-CM | POA: Diagnosis not present

## 2020-04-09 DIAGNOSIS — Z01419 Encounter for gynecological examination (general) (routine) without abnormal findings: Secondary | ICD-10-CM

## 2020-04-09 DIAGNOSIS — R102 Pelvic and perineal pain: Secondary | ICD-10-CM | POA: Diagnosis not present

## 2020-04-09 NOTE — Progress Notes (Signed)
HPI:      [name] is a 39 y.o. [id number] who LMP was No LMP recorded. Patient has had a hysterectomy.  Subjective:   She presents today for her annual examination.    Patient was diagnosed with endometriosis by in 2011 The patient underwent laparoscopic supracervical hysterectomy bilateral salpingectomy in 2015 by Dr. Cristino Martes; pathology demonstrated severe adenomyosis and leiomyoma uteri. Over the past several years she has experienced occasional irregular vaginal bleeding/spotting.  This occasionally concerns her and she would like to discuss this. She also states that she had a single episode of severe pelvic cramping/pain that reminded her of her previous endometriosis.  This pain has not reoccurred since that time.  It was not associated with bowel movements or urination.  It was not associated with any of the vaginal bleeding as described above.  She has not had pain with intercourse.    Hx: The following portions of the patient's history were reviewed and updated as appropriate:             She  has a past medical history of Endometriosis/adenomyosis, Fibroid, and Genital herpes. She does not have any pertinent problems on file. She  has a past surgical history that includes Endometrial ablation; Dilation and curettage of uterus; Pelvic laparoscopy; Laparoscopic supracervical hysterectomy bilateral salpingectomy (2015); and Breast biopsy (Right, 05/01/2019). Her family history includes Bipolar disorder in her mother; Depression in her mother; Diabetes in her maternal aunt, maternal uncle, and mother; Hypertension in her mother. She  reports that she has never smoked. She has never used smokeless tobacco. She reports current alcohol use. She reports that she does not use drugs. She has a current medication list which includes the following prescription(s): acyclovir, bupropion, cyclobenzaprine, fluticasone, and gabapentin. She has No Known Allergies.       Review of Systems:   Review of Systems  Constitutional: Denied constitutional symptoms, night sweats, recent illness, fatigue, fever, insomnia and weight loss.  Eyes: Denied eye symptoms, eye pain, photophobia, vision change and visual disturbance.  Ears/Nose/Throat/Neck: Denied ear, nose, throat or neck symptoms, hearing loss, nasal discharge, sinus congestion and sore throat.  Cardiovascular: Denied cardiovascular symptoms, arrhythmia, chest pain/pressure, edema, exercise intolerance, orthopnea and palpitations.  Respiratory: Denied pulmonary symptoms, asthma, pleuritic pain, productive sputum, cough, dyspnea and wheezing.  Gastrointestinal: Denied, gastro-esophageal reflux, melena, nausea and vomiting.  Genitourinary: See HPI for additional information.  Musculoskeletal: Denied musculoskeletal symptoms, stiffness, swelling, muscle weakness and myalgia.  Dermatologic: Denied dermatology symptoms, rash and scar.  Neurologic: Denied neurology symptoms, dizziness, headache, neck pain and syncope.  Psychiatric: Denied psychiatric symptoms, anxiety and depression.  Endocrine: Denied endocrine symptoms including hot flashes and night sweats.   Meds:   Current Outpatient Medications on File Prior to Visit  Medication Sig Dispense Refill  . acyclovir (ZOVIRAX) 400 MG tablet TAKE 1 TABLET(400 MG) BY MOUTH TWICE DAILY 90 tablet 3  . buPROPion (WELLBUTRIN XL) 150 MG 24 hr tablet Take 1 tablet (150 mg total) by mouth daily. 30 tablet 5  . cyclobenzaprine (FLEXERIL) 5 MG tablet TAKE 1 TABLET(5 MG) BY MOUTH THREE TIMES DAILY AS NEEDED FOR MUSCLE SPASMS 30 tablet 1  . fluticasone (FLONASE) 50 MCG/ACT nasal spray Place 2 sprays into both nostrils daily. 16 g 0  . gabapentin (NEURONTIN) 100 MG capsule TAKE 1 CAPSULE(100 MG) BY MOUTH THREE TIMES DAILY 90 capsule 0   No current facility-administered medications on file prior to visit.    Objective:     Vitals:  04/09/20 0810  BP: 130/84  Pulse: 75               Physical examination General NAD, Conversant  HEENT Atraumatic; Op clear with mmm.  Normo-cephalic. Pupils reactive. Anicteric sclerae  Thyroid/Neck Smooth without nodularity or enlargement. Normal ROM.  Neck Supple.  Skin No rashes, lesions or ulceration. Normal palpated skin turgor. No nodularity.  Breasts: No masses or discharge.  Symmetric.  No axillary adenopathy.  Lungs: Clear to auscultation.No rales or wheezes. Normal Respiratory effort, no retractions.  Heart: NSR.  No murmurs or rubs appreciated. No periferal edema  Abdomen: Soft.  Non-tender.  No masses.  No HSM. No hernia  Extremities: Moves all appropriately.  Normal ROM for age. No lymphadenopathy.  Neuro: Oriented to PPT.  Normal mood. Normal affect.     Pelvic:   Vulva: Normal appearance.  No lesions.  Vagina: No lesions or abnormalities noted.  Support: Normal pelvic support.  Urethra No masses tenderness or scarring.  Meatus Normal size without lesions or prolapse.  Cervix: Normal appearance.  No lesions.  Small amount of bleeding noted with Pap.  Attempt at sclerosing some of the endometrial glands using silver nitrate performed.  Anus: Normal exam.  No lesions.  Perineum: Normal exam.  No lesions.        Bimanual   Uterus:  Surgically absent  Adnexae: No masses.  Non-tender to palpation.  Cul-de-sac: Negative for abnormality.      Assessment:    [id number] Patient Active Problem List   Diagnosis Date Noted  . Foot trauma, left, subsequent encounter 05/23/2018  . Abnormal vaginal bleeding 01/09/2017  . STD exposure 01/09/2017  . ADD (attention deficit disorder) 06/29/2015  . Clinical depression 06/29/2015  . Esophagitis, reflux 06/29/2015  . Adult hypothyroidism 06/29/2015  . Headache, migraine 06/29/2015  . Pure hypercholesterolemia 06/29/2015  . Allergic rhinitis 07/16/2009  . Absolute anemia 07/16/2009  . Essential (primary) hypertension 07/16/2009  . Genital herpes 02/10/2009     1. Well woman exam  with routine gynecological exam   2. Pelvic pain in female   3. Complaint of vaginal bleeding     Patient is likely having occasional cyclic bleeding because of her endometrial glands/endocervical glands located within the cervix.  I have explained to her that this is not a concern for persistent disease of any type simply a result of supracervical hysterectomy.  Sclerosing some of the most external glands may be helpful.  Patient's pelvic pain seems to be a one-time occurrence.  We have discussed the possibility of endometriosis.   Plan:            1.  Basic Screening Recommendations The basic screening recommendations for asymptomatic women were discussed with the patient during her visit.  The age-appropriate recommendations were discussed with her and the rational for the tests reviewed.  When I am informed by the patient that another primary care physician has previously obtained the age-appropriate tests and they are up-to-date, only outstanding tests are ordered and referrals given as necessary.  Abnormal results of tests will be discussed with her when all of her results are completed.  Routine preventative health maintenance measures emphasized: Exercise/Diet/Weight control, Tobacco Warnings, Alcohol/Substance use risks and Stress Management Pap performed -mammogram at age 83 2.  Expectant management of pelvic pain.  If it becomes more persistent would consider ultrasound as first line of diagnostic evaluation. Orders No orders of the defined types were placed in this encounter.   No orders of the  defined types were placed in this encounter.       F/U  No follow-ups on file. I spent 13 minutes involved in the care of this patient preparing to see the patient by obtaining and reviewing her medical history (including labs, imaging tests and prior procedures), documenting clinical information in the electronic health record (EHR), counseling and coordinating care plans, writing and  sending prescriptions, ordering tests or procedures and directly communicating with the patient by discussing pertinent items from her history and physical exam as well as detailing my assessment and plan as noted above so that she has an informed understanding.  All of her questions were answered. Finis Bud, M.D. 04/09/2020 8:50 AM

## 2020-04-09 NOTE — Addendum Note (Signed)
Addended by: Durwin Glaze on: 04/09/2020 09:13 AM   Modules accepted: Orders

## 2020-04-10 LAB — CYTOLOGY - PAP
Comment: NEGATIVE
Diagnosis: NEGATIVE
High risk HPV: NEGATIVE

## 2020-05-11 NOTE — Progress Notes (Deleted)
     Established patient visit   Patient: Daisy Foley   DOB: 1981-03-28   39 y.o. Female  MRN: [id number] Visit Date: 05/12/2020  Today's healthcare provider: [name], MD   No chief complaint on file.  Subjective    HPI  Depression, Follow-up  She  was last seen for this 2 months ago. Changes made at last visit include;This is a recurrent depression.  Consider counseling or consider psychiatric referral if she does not improve.  Hopefully the Wellbutrin will also help her lose some weight which would help.   She reports {excellent/good/fair/poor:19665} compliance with treatment. She {is/is not:21021397} having side effects. ***  She reports {DESC; GOOD/FAIR/POOR:18685} tolerance of treatment. Current symptoms include: {Symptoms; depression:1002} She feels she is {improved/worse/unchanged:3041574} since last visit.  Depression screen [facility] 2/9 03/10/2020 02/06/2020  Decreased Interest 1 1  Down, Depressed, Hopeless 1 1  PHQ - 2 Score 2 2  Altered sleeping 0 1  Tired, decreased energy 2 2  Change in appetite 2 2  Feeling bad or failure about yourself  2 2  Trouble concentrating 0 1  Moving slowly or fidgety/restless 0 0  Suicidal thoughts 0 0  PHQ-9 Score 8 10  Difficult doing work/chores Somewhat difficult -    -----------------------------------------------------------------------------------------  Plantar fasciitis of left foot From 03/10/2020-Due to the significant discomfort she is having we will keep her out of work for the rest of this week.  Try Celebrex 200 mg daily wear good shoes stop naproxen and refer her to Dr. Caryl Comes from podiatry.   {Show patient history (optional):23778::" "}   Medications: Outpatient Medications Prior to Visit  Medication Sig  . acyclovir (ZOVIRAX) 400 MG tablet TAKE 1 TABLET(400 MG) BY MOUTH TWICE DAILY  . buPROPion (WELLBUTRIN XL) 150 MG 24 hr tablet Take 1 tablet (150 mg total) by mouth daily.  . cyclobenzaprine  (FLEXERIL) 5 MG tablet TAKE 1 TABLET(5 MG) BY MOUTH THREE TIMES DAILY AS NEEDED FOR MUSCLE SPASMS  . fluticasone (FLONASE) 50 MCG/ACT nasal spray Place 2 sprays into both nostrils daily.  Marland Kitchen gabapentin (NEURONTIN) 100 MG capsule TAKE 1 CAPSULE(100 MG) BY MOUTH THREE TIMES DAILY   No facility-administered medications prior to visit.    Review of Systems  Constitutional: Negative for appetite change, chills, fatigue and fever.  Respiratory: Negative for chest tightness and shortness of breath.   Cardiovascular: Negative for chest pain and palpitations.  Gastrointestinal: Negative for abdominal pain, nausea and vomiting.  Neurological: Negative for dizziness and weakness.    {Heme  Chem  Endocrine  Serology  Results Review (optional):23779::" "}  Objective    There were no vitals taken for this visit. {Show previous vital signs (optional):23777::" "}  Physical Exam  ***  No results found for any visits on 05/12/20.  Assessment & Plan     ***  No follow-ups on file.      {provider attestation***:1}   [name], MD  [facility] [phone] (phone) [phone] (fax)  [place]

## 2020-05-12 ENCOUNTER — Ambulatory Visit: Payer: BC Managed Care – PPO | Admitting: Family Medicine

## 2020-07-13 IMAGING — CR DG ANKLE COMPLETE 3+V*L*
1 series · 3 of 3 positions shown · non-contrast
Comparison: Left foot radiographs June 07, 2018

CLINICAL DATA: Ankle pain with plantar fascitis

EXAM:
LEFT ANKLE COMPLETE - 3+ VIEW

[Series 1: dg ankle complete left · 0.14mm/px · 3 of 3 slices shown]
[im 1/3]
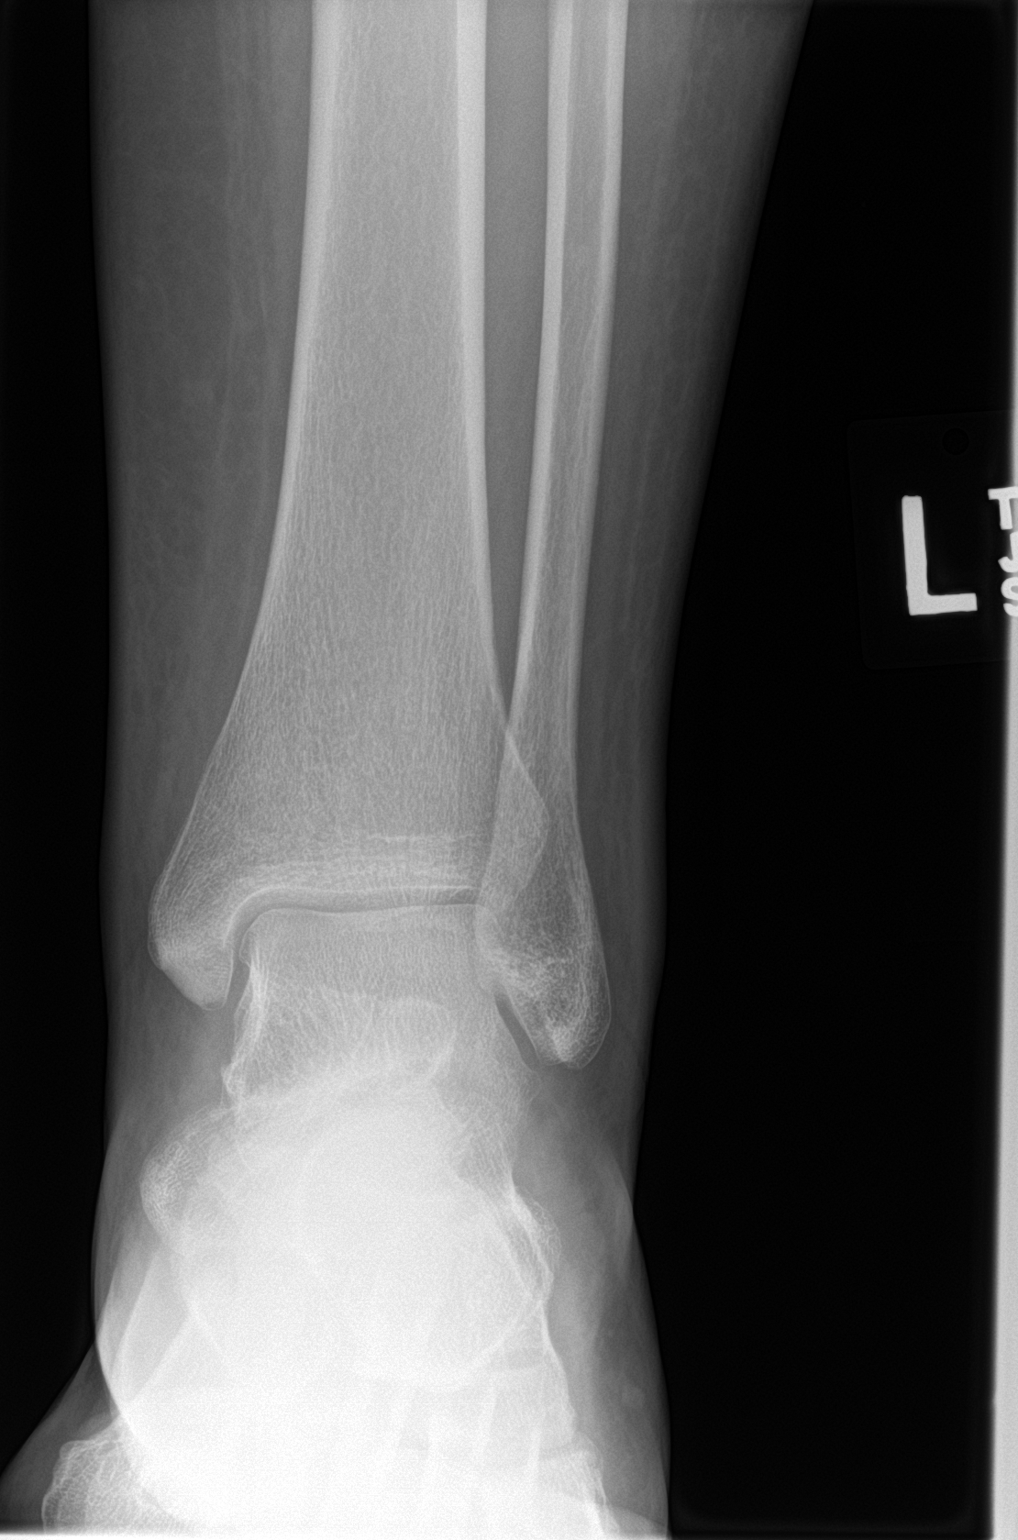
[im 2/3]
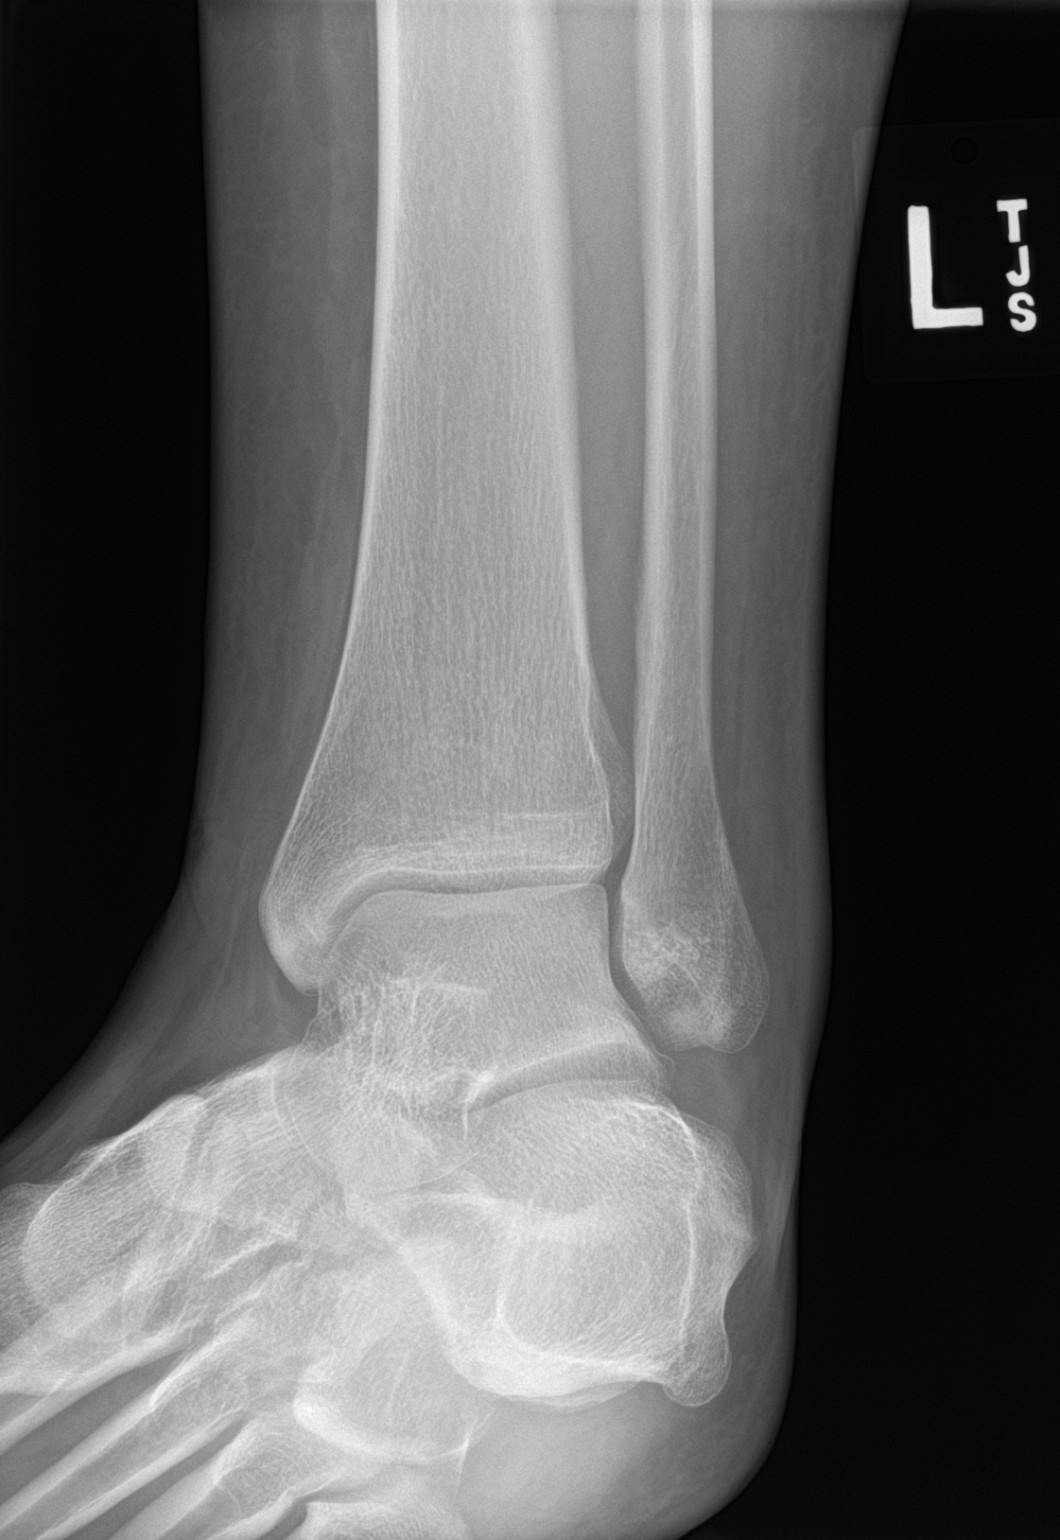
[im 3/3]
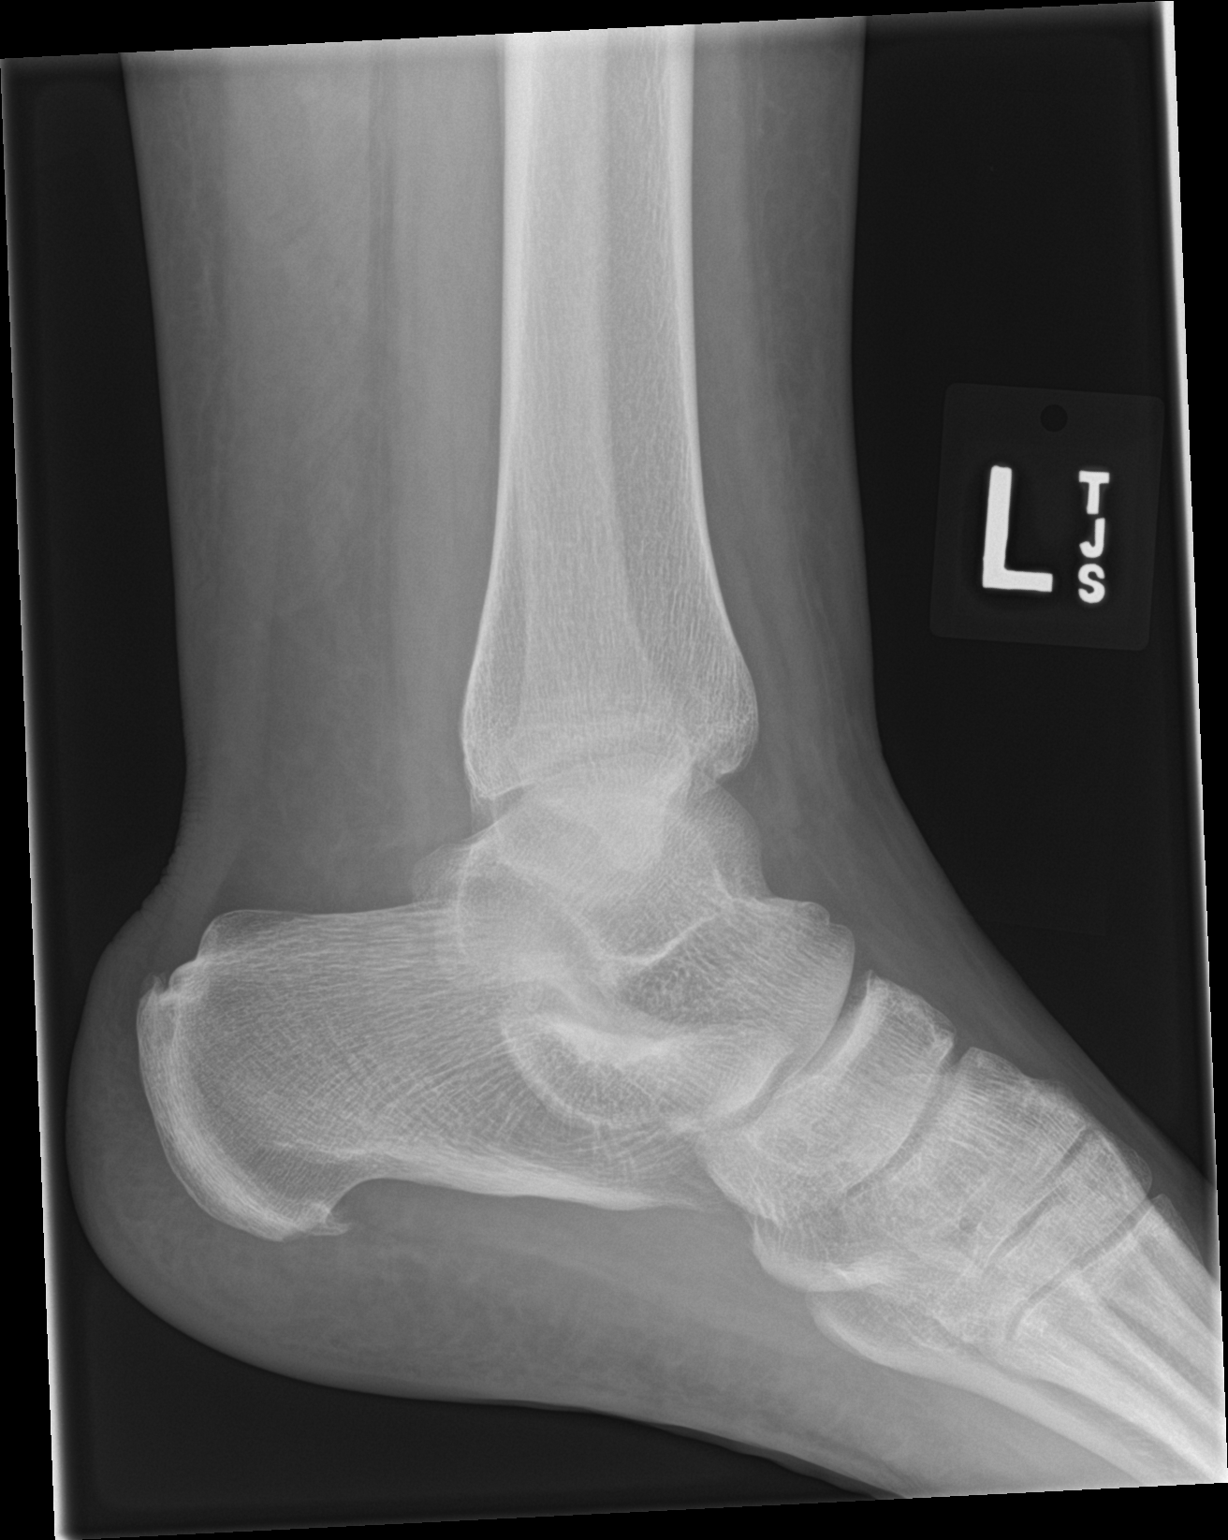

[3 of 3 positions shown; findings below may reference images not displayed]

FINDINGS: Frontal, oblique, and lateral views were obtained. No fracture or
joint effusion. No joint space narrowing or erosion. There are small
posterior and inferior calcaneal spurs. Ankle mortise appears
intact.
IMPRESSION: Small calcaneal spurs. No appreciable joint space narrowing or
erosion. No evident fracture. Ankle mortise appears intact.

## 2021-01-08 ENCOUNTER — Other Ambulatory Visit: Payer: Self-pay | Admitting: Family Medicine

## 2021-01-08 DIAGNOSIS — F32A Depression, unspecified: Secondary | ICD-10-CM

## 2021-01-08 NOTE — Telephone Encounter (Signed)
Requested medications are due for refill today yes  Requested medications are on the active medication list yes  Last refill 3/16  Last visit 02/2020  Future visit scheduled no  Notes to clinic Failed protocol due to no valid visit within 6  months, no upcoming visit scheduled.

## 2021-03-15 ENCOUNTER — Other Ambulatory Visit: Payer: Self-pay | Admitting: Family Medicine

## 2021-03-15 DIAGNOSIS — F32A Depression, unspecified: Secondary | ICD-10-CM

## 2021-03-15 NOTE — Telephone Encounter (Signed)
Requested medication (s) are due for refill today:no  Requested medication (s) are on the active medication list: yes  Last refill: 01/09/2021  Future visit scheduled no  Notes to clinic: overdue for office visit  Message has been sent for patient to contact office    Requested Prescriptions  Pending Prescriptions Disp Refills   buPROPion (WELLBUTRIN XL) 150 MG 24 hr tablet [Pharmacy Med Name: BUPROPION XL 150MG  TABLETS (24 H)] 30 tablet 0    Sig: TAKE 1 TABLET(150 MG) BY MOUTH DAILY      Psychiatry: Antidepressants - bupropion Failed - 03/15/2021  8:26 AM      Failed - Completed PHQ-2 or PHQ-9 in the last 360 days      Failed - Valid encounter within last 6 months    Recent Outpatient Visits           1 year ago Plantar fasciitis of left foot   [facility] Jerrol Banana., MD   1 year ago Foot pain, left   [facility] Jerrol Banana., MD   1 year ago Constipation, unspecified constipation type   [facility] [place], Wendee Beavers, [place]   1 year ago Tendinopathy of right rotator cuff   [facility] [place], Clearnce Sorrel, [place]   1 year ago Lymphadenopathy, axillary   [facility] [place], Clearnce Sorrel, [place]       Future Appointments             In 4 weeks Amalia Hailey, Nyoka Lint, MD [facility] - Last BP in normal range    BP Readings from Last 1 Encounters:  04/09/20 130/84            acyclovir (ZOVIRAX) 400 MG tablet [Pharmacy Med Name: ACYCLOVIR 400MG  TABLETS] 90 tablet 3    Sig: TAKE 1 TABLET(400 MG) BY MOUTH TWICE DAILY      Antimicrobials:  Antiviral Agents - Anti-Herpetic Failed - 03/15/2021  8:26 AM      Failed - Valid encounter within last 12 months    Recent Outpatient Visits           1 year ago Plantar fasciitis of left foot   [facility] Jerrol Banana., MD   1 year ago Foot pain, left   [facility]  Jerrol Banana., MD   1 year ago Constipation, unspecified constipation type   [facility] [place], Wendee Beavers, [place]   1 year ago Tendinopathy of right rotator cuff   [facility] [place], Clearnce Sorrel, [place]   1 year ago Lymphadenopathy, axillary   [place], Clearnce Sorrel, [place]       Future Appointments             In 4 weeks Amalia Hailey, Nyoka Lint, MD [facility]

## 2021-04-07 ENCOUNTER — Encounter: Payer: Self-pay | Admitting: Family Medicine

## 2021-04-07 ENCOUNTER — Other Ambulatory Visit: Payer: Self-pay

## 2021-04-07 ENCOUNTER — Ambulatory Visit (INDEPENDENT_AMBULATORY_CARE_PROVIDER_SITE_OTHER): Payer: No Typology Code available for payment source | Admitting: Family Medicine

## 2021-04-07 VITALS — BP 116/70 | HR 79 | Resp 16 | Ht 72.0 in | Wt 250.0 lb

## 2021-04-07 DIAGNOSIS — F339 Major depressive disorder, recurrent, unspecified: Secondary | ICD-10-CM | POA: Diagnosis not present

## 2021-04-07 DIAGNOSIS — F32A Depression, unspecified: Secondary | ICD-10-CM | POA: Diagnosis not present

## 2021-04-07 MED ORDER — BUPROPION HCL ER (XL) 150 MG PO TB24: ORAL_TABLET | ORAL | 1 refills | Status: DC

## 2021-04-07 MED ORDER — SERTRALINE HCL 50 MG PO TABS: 50.0000 mg | ORAL_TABLET | Freq: Every day | ORAL | 1 refills | Status: DC

## 2021-04-07 NOTE — Progress Notes (Signed)
I,Daisy Foley,acting as a scribe for Daisy Durie, MD.,have documented all relevant documentation on the behalf of Daisy Durie, MD,as directed by  Daisy Durie, MD while in the presence of Daisy Durie, MD.   Established patient visit   Patient: Daisy Foley   DOB: 08/09/1981   40 y.o. Female  MRN: [id number] Visit Date: 04/07/2021  Today's healthcare provider: [name], MD   Chief Complaint  Patient presents with   Follow-up   Depression   Subjective    HPI  Patient comes in today for follow-up.  She has been out of her bupropion for 3 weeks.  She is having some issues as her mother is now living with her aunt who has significant mental illness. Patient has some stress and some mild depressed mood and anhedonia.  Certainly not homicidal or suicidal. She has reached out to [employer] at work to possibly get some help. Depression, Follow-up  She  was last seen for this 1 years ago. Changes made at last visit include continue Wellbutrin 150mg  daily.   She reports good compliance with treatment. She is not having side effects. none  She reports good tolerance of treatment. Current symptoms include: depressed mood, difficulty concentrating, fatigue, and hopelessness She feels she is Worse since last visit.  Depression screen [facility] 2/9 04/07/2021 03/10/2020 02/06/2020  Decreased Interest 2 1 1   Down, Depressed, Hopeless 1 1 1   PHQ - 2 Score 3 2 2   Altered sleeping 0 0 1  Tired, decreased energy 2 2 2   Change in appetite 1 2 2   Feeling bad or failure about yourself  0 2 2  Trouble concentrating 3 0 1  Moving slowly or fidgety/restless 0 0 0  Suicidal thoughts 0 0 0  PHQ-9 Score 9 8 10   Difficult doing work/chores Somewhat difficult Somewhat difficult -    ----------------------------------------------------------------------------       Medications: Outpatient Medications Prior to Visit  Medication Sig   acyclovir (ZOVIRAX) 400 MG  tablet TAKE 1 TABLET(400 MG) BY MOUTH TWICE DAILY   cyclobenzaprine (FLEXERIL) 5 MG tablet TAKE 1 TABLET(5 MG) BY MOUTH THREE TIMES DAILY AS NEEDED FOR MUSCLE SPASMS   fluticasone (FLONASE) 50 MCG/ACT nasal spray Place 2 sprays into both nostrils daily.   gabapentin (NEURONTIN) 100 MG capsule TAKE 1 CAPSULE(100 MG) BY MOUTH THREE TIMES DAILY   [DISCONTINUED] buPROPion (WELLBUTRIN XL) 150 MG 24 hr tablet TAKE 1 TABLET(150 MG) BY MOUTH DAILY   No facility-administered medications prior to visit.    Review of Systems  Constitutional:  Negative for activity change and fatigue.  Respiratory:  Negative for cough.   Cardiovascular:  Negative for chest pain, palpitations and leg swelling.  Psychiatric/Behavioral:  Negative for agitation, self-injury, sleep disturbance and suicidal ideas. The patient is not nervous/anxious.        Objective    BP 116/70 (BP Location: Right Arm, Patient Position: Sitting, Cuff Size: Large)   Pulse 79   Resp 16   Ht 6' (1.829 m)   Wt 250 lb (113.4 kg)   SpO2 97%   BMI 33.91 kg/m  BP Readings from Last 3 Encounters:  04/07/21 116/70  04/09/20 130/84  03/10/20 116/78   Wt Readings from Last 3 Encounters:  04/07/21 250 lb (113.4 kg)  04/09/20 246 lb 9.6 oz (111.9 kg)  03/10/20 254 lb 9.6 oz (115.5 kg)       Physical Exam Vitals reviewed.  Constitutional:  General: She is not in acute distress.    Appearance: Normal appearance. She is well-developed. She is not ill-appearing.  HENT:     Head: Normocephalic and atraumatic.  Pulmonary:     Effort: Pulmonary effort is normal. No respiratory distress.  Musculoskeletal:     Cervical back: Normal range of motion and neck supple.  Neurological:     Mental Status: She is alert.  Psychiatric:        Mood and Affect: Mood normal.        Behavior: Behavior normal.        Thought Content: Thought content normal.        Judgment: Judgment normal.      No results found for any visits on 04/07/21.   Assessment & Plan     1. Depression, unspecified depression type Restarted Wellbutrin XL qd. Start sertraline 50 mg qd. Also seek out counseling at [facility].  Follow-up here in 2 months. - sertraline (ZOLOFT) 50 MG tablet; Take 1 tablet (50 mg total) by mouth daily.  Dispense: 30 tablet; Refill: 1 - buPROPion (WELLBUTRIN XL) 150 MG 24 hr tablet; TAKE 1 TABLET(150 MG) BY MOUTH DAILY  Dispense: 30 tablet; Refill: 1   Return in about 2 months (around 06/08/2021).      I, Daisy Durie, MD, have reviewed all documentation for this visit. The documentation on 04/11/21 for the exam, diagnosis, procedures, and orders are all accurate and complete.    Tanith Dagostino Cranford Mon, MD  [facility] [phone] (phone) [phone] (fax)  [place]

## 2021-04-08 ENCOUNTER — Other Ambulatory Visit: Payer: Self-pay | Admitting: Family Medicine

## 2021-04-08 MED ORDER — ACYCLOVIR 400 MG PO TABS: ORAL_TABLET | ORAL | 3 refills | Status: DC

## 2021-04-08 NOTE — Telephone Encounter (Signed)
Medication: acyclovir (ZOVIRAX) 400 MG tablet  Has the pt contacted their pharmacy? No, she had appt yesterday and forgot to tell the dr this one needs refill Preferred pharmacy:  [place], [place]  Please be advised refills may take up to 3 business days.  We ask that you follow up with your pharmacy.

## 2021-04-13 ENCOUNTER — Ambulatory Visit (INDEPENDENT_AMBULATORY_CARE_PROVIDER_SITE_OTHER): Payer: No Typology Code available for payment source | Admitting: Obstetrics and Gynecology

## 2021-04-13 ENCOUNTER — Encounter: Payer: Self-pay | Admitting: Obstetrics and Gynecology

## 2021-04-13 ENCOUNTER — Other Ambulatory Visit: Payer: Self-pay

## 2021-04-13 VITALS — BP 138/87 | HR 74 | Ht 72.0 in | Wt 245.0 lb

## 2021-04-13 DIAGNOSIS — Z01419 Encounter for gynecological examination (general) (routine) without abnormal findings: Secondary | ICD-10-CM | POA: Diagnosis not present

## 2021-04-13 DIAGNOSIS — Z8742 Personal history of other diseases of the female genital tract: Secondary | ICD-10-CM

## 2021-04-13 NOTE — Progress Notes (Signed)
HPI:      [name] is a 40 y.o. [id number] who LMP was No LMP recorded. Patient has had a hysterectomy.  Subjective:   She presents today for her annual examination.  She has a history of endometriosis and underwent hysterectomy (supracervical) for this.  Her pain is mostly improved but she reports that several times per year she gets some left-sided pelvic pain which lasts a day or 2.  She has the concern that her endometriosis may be reoccurring.  She also has spotting approximately 4 random times per year.  We attempted to sclerose the endocervical glands last year but this did not seem to make much difference based on her history.  She reports that the pain is not disabling and the bleeding is not a huge problem-she can deal with it at this time.    Hx: The following portions of the patient's history were reviewed and updated as appropriate:             She  has a past medical history of Endometriosis/adenomyosis, Fibroid, and Genital herpes. She does not have any pertinent problems on file. She  has a past surgical history that includes Endometrial ablation; Dilation and curettage of uterus; Pelvic laparoscopy; Laparoscopic supracervical hysterectomy bilateral salpingectomy (2015); and Breast biopsy (Right, 05/01/2019). Her family history includes Bipolar disorder in her mother; Depression in her mother; Diabetes in her maternal aunt, maternal uncle, and mother; Hypertension in her mother. She  reports that she has never smoked. She has never used smokeless tobacco. She reports current alcohol use. She reports that she does not use drugs. She has a current medication list which includes the following prescription(s): acyclovir, bupropion, cyclobenzaprine, fluticasone, gabapentin, and sertraline. She has No Known Allergies.       Review of Systems:  Review of Systems  Constitutional: Denied constitutional symptoms, night sweats, recent illness, fatigue, fever, insomnia and weight  loss.  Eyes: Denied eye symptoms, eye pain, photophobia, vision change and visual disturbance.  Ears/Nose/Throat/Neck: Denied ear, nose, throat or neck symptoms, hearing loss, nasal discharge, sinus congestion and sore throat.  Cardiovascular: Denied cardiovascular symptoms, arrhythmia, chest pain/pressure, edema, exercise intolerance, orthopnea and palpitations.  Respiratory: Denied pulmonary symptoms, asthma, pleuritic pain, productive sputum, cough, dyspnea and wheezing.  Gastrointestinal: Denied, gastro-esophageal reflux, melena, nausea and vomiting.  Genitourinary: See HPI for additional information.  Musculoskeletal: Denied musculoskeletal symptoms, stiffness, swelling, muscle weakness and myalgia.  Dermatologic: Denied dermatology symptoms, rash and scar.  Neurologic: Denied neurology symptoms, dizziness, headache, neck pain and syncope.  Psychiatric: Denied psychiatric symptoms, anxiety and depression.  Endocrine: Denied endocrine symptoms including hot flashes and night sweats.   Meds:   Current Outpatient Medications on File Prior to Visit  Medication Sig Dispense Refill   acyclovir (ZOVIRAX) 400 MG tablet TAKE 1 TABLET(400 MG) BY MOUTH TWICE DAILY 90 tablet 3   buPROPion (WELLBUTRIN XL) 150 MG 24 hr tablet TAKE 1 TABLET(150 MG) BY MOUTH DAILY 30 tablet 1   cyclobenzaprine (FLEXERIL) 5 MG tablet TAKE 1 TABLET(5 MG) BY MOUTH THREE TIMES DAILY AS NEEDED FOR MUSCLE SPASMS 30 tablet 1   fluticasone (FLONASE) 50 MCG/ACT nasal spray Place 2 sprays into both nostrils daily. 16 g 0   gabapentin (NEURONTIN) 100 MG capsule TAKE 1 CAPSULE(100 MG) BY MOUTH THREE TIMES DAILY 90 capsule 0   sertraline (ZOLOFT) 50 MG tablet Take 1 tablet (50 mg total) by mouth daily. 30 tablet 1   No current facility-administered medications on file prior to visit.  Objective:     Vitals:   04/13/21 0804  BP: 138/87  Pulse: 74    Filed Weights   04/13/21 0804  Weight: 245 lb (111.1 kg)               Physical examination General NAD, Conversant  HEENT Atraumatic; Op clear with mmm.  Normo-cephalic. Pupils reactive. Anicteric sclerae  Thyroid/Neck Smooth without nodularity or enlargement. Normal ROM.  Neck Supple.  Skin No rashes, lesions or ulceration. Normal palpated skin turgor. No nodularity.  Breasts: No masses or discharge.  Symmetric.  No axillary adenopathy.  Lungs: Clear to auscultation.No rales or wheezes. Normal Respiratory effort, no retractions.  Heart: NSR.  No murmurs or rubs appreciated. No periferal edema  Abdomen: Soft.  Non-tender.  No masses.  No HSM. No hernia  Extremities: Moves all appropriately.  Normal ROM for age. No lymphadenopathy.  Neuro: Oriented to PPT.  Normal mood. Normal affect.     Pelvic:   Vulva: Normal appearance.  No lesions.   Vagina: No lesions or abnormalities noted.  Support: Normal pelvic support.  Urethra No masses tenderness or scarring.  Meatus Normal size without lesions or prolapse.  Cervix: Normal appearance without lesions.  No obvious ectropion.  Anus: Normal exam.  No lesions.  Perineum: Normal exam.  No lesions.        Bimanual   Uterus: Surgically absent   Adnexae: No masses.  Non-tender to palpation.  Cul-de-sac: Negative for abnormality.     Assessment:    [id number] Patient Active Problem List   Diagnosis Date Noted   Foot trauma, left, subsequent encounter 05/23/2018   Abnormal vaginal bleeding 01/09/2017   STD exposure 01/09/2017   ADD (attention deficit disorder) 06/29/2015   Clinical depression 06/29/2015   Esophagitis, reflux 06/29/2015   Adult hypothyroidism 06/29/2015   Headache, migraine 06/29/2015   Pure hypercholesterolemia 06/29/2015   Allergic rhinitis 07/16/2009   Absolute anemia 07/16/2009   Essential (primary) hypertension 07/16/2009   Genital herpes 02/10/2009     1. Well woman exam with routine gynecological exam   2. History of endometriosis     Occasional spotting -likely from  endocervical glands.  Occasional intermittent pelvic pain left-sided.  Not disabling.  Plan:            1.  Basic Screening Recommendations The basic screening recommendations for asymptomatic women were discussed with the patient during her visit.  The age-appropriate recommendations were discussed with her and the rational for the tests reviewed.  When I am informed by the patient that another primary care physician has previously obtained the age-appropriate tests and they are up-to-date, only outstanding tests are ordered and referrals given as necessary.  Abnormal results of tests will be discussed with her when all of her results are completed.  Routine preventative health maintenance measures emphasized: Exercise/Diet/Weight control, Tobacco Warnings, Alcohol/Substance use risks and Stress Management 2.  We have discussed future management of possible recurrent endometriosis.  OCPs -induce false menopause with GnRH like drug -future surgery including lysis of adhesions/ablation of endometriosis/possible oophorectomy.  She will inform us if her pain becomes significantly worse and we will work this up and consider other options. Orders Orders Placed This Encounter  Procedures   Hemoglobin A1c   Lipid panel   TSH    No orders of the defined types were placed in this encounter.       F/U  Return in about 1 year (around 04/13/2022) for Annual Physical.  Finis Bud, M.D.  04/13/2021 8:26 AM

## 2021-04-14 LAB — LIPID PANEL
Chol/HDL Ratio: 4.5 ratio — ABNORMAL HIGH (ref 0.0–4.4)
Cholesterol, Total: 153 mg/dL (ref 100–199)
HDL: 34 mg/dL — ABNORMAL LOW (ref >39)
LDL Chol Calc (NIH): 99 mg/dL (ref 0–99)
Triglycerides: 111 mg/dL (ref 0–149)
VLDL Cholesterol Cal: 20 mg/dL (ref 5–40)

## 2021-04-14 LAB — HEMOGLOBIN A1C
Est. average glucose Bld gHb Est-mCnc: 117 mg/dL
Hgb A1c MFr Bld: 5.7 % — ABNORMAL HIGH (ref 4.8–5.6)

## 2021-04-14 LAB — TSH: TSH: 2.62 u[IU]/mL (ref 0.450–4.500)

## 2021-05-14 ENCOUNTER — Telehealth: Payer: No Typology Code available for payment source | Admitting: Physician Assistant

## 2021-05-14 DIAGNOSIS — N760 Acute vaginitis: Secondary | ICD-10-CM | POA: Diagnosis not present

## 2021-05-14 DIAGNOSIS — B9689 Other specified bacterial agents as the cause of diseases classified elsewhere: Secondary | ICD-10-CM

## 2021-05-14 MED ORDER — METRONIDAZOLE 500 MG PO TABS: 500.0000 mg | ORAL_TABLET | Freq: Two times a day (BID) | ORAL | 0 refills | Status: DC

## 2021-05-14 MED ORDER — FLUCONAZOLE 150 MG PO TABS: 150.0000 mg | ORAL_TABLET | Freq: Once | ORAL | 0 refills | Status: AC

## 2021-05-14 NOTE — Progress Notes (Signed)
I have spent 5 minutes in review of e-visit questionnaire, review and updating patient chart, medical decision making and response to patient.   Chrstopher Malenfant Cody Saurabh Hettich, PA-C    

## 2021-05-14 NOTE — Progress Notes (Signed)
E-Visit for Vaginal Symptoms  We are sorry that you are not feeling well. Here is how we plan to help! Based on what you shared with me it looks like you: May have a vaginosis due to bacteria  Vaginosis is an inflammation of the vagina that can result in discharge, itching and pain. The cause is usually a change in the normal balance of vaginal bacteria or an infection. Vaginosis can also result from reduced estrogen levels after menopause.  The most common causes of vaginosis are:   Bacterial vaginosis which results from an overgrowth of one on several organisms that are normally present in your vagina.   Yeast infections which are caused by a naturally occurring fungus called candida.   Vaginal atrophy (atrophic vaginosis) which results from the thinning of the vagina from reduced estrogen levels after menopause.   Trichomoniasis which is caused by a parasite and is commonly transmitted by sexual intercourse.  Factors that increase your risk of developing vaginosis include: Medications, such as antibiotics and steroids Uncontrolled diabetes Use of hygiene products such as bubble bath, vaginal spray or vaginal deodorant Douching Wearing damp or tight-fitting clothing Using an intrauterine device (IUD) for birth control Hormonal changes, such as those associated with pregnancy, birth control pills or menopause Sexual activity Having a sexually transmitted infection  Your treatment plan is Metronidazole or Flagyl '500mg'$  twice a day for 7 days.  I have electronically sent this prescription into the pharmacy that you have chosen. I have also sent in a single diflucan in case of yeast from antibiotic use.   Be sure to take all of the medication as directed. Stop taking any medication if you develop a rash, tongue swelling or shortness of breath. Mothers who are breast feeding should consider pumping and discarding their breast milk while on these antibiotics. However, there is no consensus  that infant exposure at these doses would be harmful.  Remember that medication creams can weaken latex condoms. Marland Kitchen   HOME CARE:  Good hygiene may prevent some types of vaginosis from recurring and may relieve some symptoms:  Avoid baths, hot tubs and whirlpool spas. Rinse soap from your outer genital area after a shower, and dry the area well to prevent irritation. Don't use scented or harsh soaps, such as those with deodorant or antibacterial action. Avoid irritants. These include scented tampons and pads. Wipe from front to back after using the toilet. Doing so avoids spreading fecal bacteria to your vagina.  Other things that may help prevent vaginosis include:  Don't douche. Your vagina doesn't require cleansing other than normal bathing. Repetitive douching disrupts the normal organisms that reside in the vagina and can actually increase your risk of vaginal infection. Douching won't clear up a vaginal infection. Use a latex condom. Both female and female latex condoms may help you avoid infections spread by sexual contact. Wear cotton underwear. Also wear pantyhose with a cotton crotch. If you feel comfortable without it, skip wearing underwear to bed. Yeast thrives in [employer] Your symptoms should improve in the next day or two.  GET HELP RIGHT AWAY IF:  You have pain in your lower abdomen ( pelvic area or over your ovaries) You develop nausea or vomiting You develop a fever Your discharge changes or worsens You have persistent pain with intercourse You develop shortness of breath, a rapid pulse, or you faint.  These symptoms could be signs of problems or infections that need to be evaluated by a medical provider now.  MAKE SURE YOU   Understand these instructions. Will watch your condition. Will get help right away if you are not doing well or get worse.  Thank you for choosing an e-visit.  Your e-visit answers were reviewed by a board certified advanced  clinical practitioner to complete your personal care plan. Depending upon the condition, your plan could have included both over the counter or prescription medications.  Please review your pharmacy choice. Make sure the pharmacy is open so you can pick up prescription now. If there is a problem, you may contact your provider through [employer] and have the prescription routed to another pharmacy.  Your safety is important to [place]. If you have drug allergies check your prescription carefully.   For the next 24 hours you can use MyChart to ask questions about today's visit, request a non-urgent call back, or ask for a work or school excuse. You will get an email in the next two days asking about your experience. I hope that your e-visit has been valuable and will speed your recovery.

## 2021-06-08 ENCOUNTER — Other Ambulatory Visit: Payer: Self-pay

## 2021-06-08 ENCOUNTER — Encounter: Payer: Self-pay | Admitting: Family Medicine

## 2021-06-08 ENCOUNTER — Ambulatory Visit (INDEPENDENT_AMBULATORY_CARE_PROVIDER_SITE_OTHER): Payer: No Typology Code available for payment source | Admitting: Family Medicine

## 2021-06-08 VITALS — BP 123/85 | HR 79 | Resp 16 | Ht 72.0 in | Wt 244.0 lb

## 2021-06-08 DIAGNOSIS — F339 Major depressive disorder, recurrent, unspecified: Secondary | ICD-10-CM

## 2021-06-08 DIAGNOSIS — Z23 Encounter for immunization: Secondary | ICD-10-CM | POA: Diagnosis not present

## 2021-06-08 NOTE — Progress Notes (Signed)
I,[name],acting as a scribe for [name], MD.,have documented all relevant documentation on the behalf of [name], MD,as directed by  [name], MD while in the presence of [name], MD.   Established patient visit   Patient: Daisy Foley   DOB: 01-20-1981   40 y.o. Female  MRN: [id number] Visit Date: 06/08/2021  Today's healthcare provider: [name], MD   Chief Complaint  Patient presents with   Follow-up   Depression   Subjective    HPI  Patient is feeling much better since starting the sertraline.  He states she feels like her old self again.  She is very pleased with how things are going on present regimen. Depression, Follow-up  She  was last seen for this 2 months ago. Changes made at last visit include; Restarted Wellbutrin XL qd. Start sertraline 50 mg qd. Also advised to seek out counseling at [facility].  Follow-up here in 2 months.   She reports good compliance with treatment. She is not having side effects. none  She reports good tolerance of treatment. Current symptoms include: depressed mood, difficulty concentrating, fatigue, and feelings of worthlessness/guilt She feels she is Improved since last visit.  Depression screen [facility] 2/9 06/08/2021 04/07/2021 03/10/2020  Decreased Interest 1 2 1   Down, Depressed, Hopeless 1 1 1   PHQ - 2 Score 2 3 2   Altered sleeping 0 0 0  Tired, decreased energy 1 2 2   Change in appetite 1 1 2   Feeling bad or failure about yourself  0 0 2  Trouble concentrating 0 3 0  Moving slowly or fidgety/restless 0 0 0  Suicidal thoughts 0 0 0  PHQ-9 Score 4 9 8   Difficult doing work/chores Not difficult at all Somewhat difficult Somewhat difficult    -----------------------------------------------------------------------------------------     Medications: Outpatient Medications Prior to Visit  Medication Sig   acyclovir (ZOVIRAX) 400 MG tablet TAKE 1 TABLET(400 MG) BY MOUTH  TWICE DAILY   buPROPion (WELLBUTRIN XL) 150 MG 24 hr tablet TAKE 1 TABLET(150 MG) BY MOUTH DAILY   cyclobenzaprine (FLEXERIL) 5 MG tablet TAKE 1 TABLET(5 MG) BY MOUTH THREE TIMES DAILY AS NEEDED FOR MUSCLE SPASMS   fluticasone (FLONASE) 50 MCG/ACT nasal spray Place 2 sprays into both nostrils daily.   gabapentin (NEURONTIN) 100 MG capsule TAKE 1 CAPSULE(100 MG) BY MOUTH THREE TIMES DAILY   sertraline (ZOLOFT) 50 MG tablet Take 1 tablet (50 mg total) by mouth daily.   [DISCONTINUED] metroNIDAZOLE (FLAGYL) 500 MG tablet Take 1 tablet (500 mg total) by mouth 2 (two) times daily.   No facility-administered medications prior to visit.    Review of Systems  Constitutional:  Negative for appetite change, chills, fatigue and fever.  Respiratory:  Negative for chest tightness and shortness of breath.   Cardiovascular:  Negative for chest pain and palpitations.  Gastrointestinal:  Negative for abdominal pain, nausea and vomiting.  Neurological:  Negative for dizziness and weakness.       Objective    BP 123/85 (BP Location: Right Arm, Patient Position: Sitting, Cuff Size: Large)   Pulse 79   Resp 16   Ht 6' (1.829 m)   Wt 244 lb (110.7 kg)   SpO2 99%   BMI 33.09 kg/m  BP Readings from Last 3 Encounters:  06/08/21 123/85  04/13/21 138/87  04/07/21 116/70   Wt Readings from Last 3 Encounters:  06/08/21 244 lb (110.7 kg)  04/13/21 245 lb (111.1 kg)  04/07/21 250 lb (113.4 kg)      Physical Exam    No results found for any visits on 06/08/21.  Assessment & Plan     1. Depression, recurrent ([place]) Complete remission presently on sertraline and Wellbutrin. Follow-up in 6 months.  2. Need for influenza vaccination  - Flu Vaccine QUAD 59mo+IM (Fluarix, Fluzone & Alfiuria Quad PF)  3. Need for Tdap vaccination  - Tdap vaccine greater than or equal to 7yo IM   Return in about 6 months (around 12/06/2021).      I, [name], MD, have reviewed all documentation for  this visit. The documentation on 06/12/21 for the exam, diagnosis, procedures, and orders are all accurate and complete.    Kennedee Kitzmiller Cranford Mon, MD  [facility] [phone] (phone) [phone] (fax)  [place]

## 2021-06-16 ENCOUNTER — Other Ambulatory Visit: Payer: Self-pay | Admitting: Family Medicine

## 2021-06-16 DIAGNOSIS — F32A Depression, unspecified: Secondary | ICD-10-CM

## 2021-07-13 ENCOUNTER — Ambulatory Visit: Payer: Self-pay | Admitting: *Deleted

## 2021-07-13 NOTE — Telephone Encounter (Signed)
    Pt reports covid positive, home test Tues 07/06/21. Symptoms onset 07/01/21 while vacationing in [place]. States given antibiotic there, started Thursday 14th. Presently with headache, severe sore throat, dry cough which had been productive for yellowish phlegm, today dry. Reports SOB with minimal exertion and "Like a wheezing in my chest." Denies fever. Reports generalized weakness and fatigue.  States feels worse today, new onset left ear ache. HAs been taking multiple OTC meds for cough, sore throat along with supplements, ineffective. Advised UC. States will follow disposition. CAre advise given. Pt verbalizes understanding.      Reason for Disposition  MILD difficulty breathing (e.g., minimal/no SOB at rest, SOB with walking, pulse <100)  Answer Assessment - Initial Assessment Questions 1. COVID-19 DIAGNOSIS: "Who made your COVID-19 diagnosis?" "Was it confirmed by a positive lab test or self-test?" If not diagnosed by a doctor (or NP/PA), ask "Are there lots of cases (community spread) where you live?" Note: See public health department website, if unsure.     Home test 1 week ago 2. COVID-19 EXPOSURE: "Was there any known exposure to COVID before the symptoms began?" CDC Definition of close contact: within 6 feet (2 meters) for a total of 15 minutes or more over a 24-hour period.      Possibly  3. ONSET: "When did the COVID-19 symptoms start?"      06/01/21 4. WORST SYMPTOM: "What is your worst symptom?" (e.g., cough, fever, shortness of breath, muscle aches)      5. COUGH: "Do you have a cough?" If Yes, ask: "How bad is the cough?"       Dry cough now, was coughing up yellow mucous 6. FEVER: "Do you have a fever?" If Yes, ask: "What is your temperature, how was it measured, and when did it start?"     No 7. RESPIRATORY STATUS: "Describe your breathing?" (e.g., shortness of breath, wheezing, unable to speak)      "Maybe wheezing in chest, like a rattling." 8. BETTER-SAME-WORSE: "Are  you getting better, staying the same or getting worse compared to yesterday?"  If getting worse, ask, "In what way?"     Worse 9. HIGH RISK DISEASE: "Do you have any chronic medical problems?" (e.g., asthma, heart or lung disease, weak immune system, obesity, etc.)      10. VACCINE: "Have you had the COVID-19 vaccine?" If Yes, ask: "Which one, how many shots, when did you get it?"       May 2021 2nd vaccine 11. BOOSTER: "Have you received your COVID-19 booster?" If Yes, ask: "Which one and when did you get it?"      no  13. OTHER SYMPTOMS: "Do you have any other symptoms?"  (e.g., chills, fatigue, headache, loss of smell or taste, muscle pain, sore throat)       Headache, sore throat, loss of taste and smell, left ear "Clogged" Generalized weakness, fatigue 14. O2 SATURATION MONITOR:  "Do you use an oxygen saturation monitor (pulse oximeter) at home?" If Yes, ask "What is your reading (oxygen level) today?" "What is your usual oxygen saturation reading?" (e.g., 95%)       NA  Protocols used: Coronavirus (COVID-19) Diagnosed or Suspected-A-AH

## 2021-07-14 ENCOUNTER — Ambulatory Visit (INDEPENDENT_AMBULATORY_CARE_PROVIDER_SITE_OTHER): Payer: No Typology Code available for payment source

## 2021-07-14 ENCOUNTER — Ambulatory Visit
Admission: RE | Admit: 2021-07-14 | Discharge: 2021-07-14 | Disposition: A | Discharge status: Home / Self Care | Payer: No Typology Code available for payment source | Source: Ambulatory Visit | Attending: Emergency Medicine | Admitting: Emergency Medicine

## 2021-07-14 ENCOUNTER — Other Ambulatory Visit: Payer: Self-pay

## 2021-07-14 VITALS — BP 133/94 | HR 89 | Temp 97.9°F | Resp 18

## 2021-07-14 DIAGNOSIS — U071 COVID-19: Secondary | ICD-10-CM | POA: Diagnosis not present

## 2021-07-14 DIAGNOSIS — I10 Essential (primary) hypertension: Secondary | ICD-10-CM

## 2021-07-14 DIAGNOSIS — J0100 Acute maxillary sinusitis, unspecified: Secondary | ICD-10-CM | POA: Diagnosis not present

## 2021-07-14 DIAGNOSIS — R059 Cough, unspecified: Secondary | ICD-10-CM

## 2021-07-14 MED ORDER — FLUCONAZOLE 150 MG PO TABS: 150.0000 mg | ORAL_TABLET | Freq: Every day | ORAL | 0 refills | Status: DC

## 2021-07-14 MED ORDER — AMOXICILLIN 875 MG PO TABS: 875.0000 mg | ORAL_TABLET | Freq: Two times a day (BID) | ORAL | 0 refills | Status: AC

## 2021-07-14 NOTE — ED Triage Notes (Addendum)
Pt COVID + since 10/13 while in [place]. Was given abx there. Still experiencing intense sx of coughing, sore throat, otalgia, congestion and headaches despite OTC meds.

## 2021-07-14 NOTE — ED Provider Notes (Signed)
[facility]    CSN: [id number] Arrival date & time: 07/14/21  0820      History   Chief Complaint Chief Complaint  Patient presents with   Cough   Nasal Congestion   Sore Throat   Headache     HPI Daisy Foley is a 40 y.o. female.  Patient presents with fatigue, headache, ear pain, sore throat, cough, shortness of breath, wheezing.  She denies fever, rash, vomiting, diarrhea, or other symptoms.  Patient tested COVID positive with at-home test on 07/06/2021.  Symptom onset 07/01/2021 while on vacation in [place]; she was treated with Zithromax in [place].  OTC treatment attempted at home.  Her medical history also includes allergic rhinitis, hypertension, hypothyroidism, migraine headaches, endometriosis, HSV, depression.  The history is provided by the patient and medical records.   Past Medical History:  Diagnosis Date   Endometriosis/adenomyosis    Fibroid    Genital herpes     Patient Active Problem List   Diagnosis Date Noted   Foot trauma, left, subsequent encounter 05/23/2018   Abnormal vaginal bleeding 01/09/2017   STD exposure 01/09/2017   ADD (attention deficit disorder) 06/29/2015   Clinical depression 06/29/2015   Esophagitis, reflux 06/29/2015   Adult hypothyroidism 06/29/2015   Headache, migraine 06/29/2015   Pure hypercholesterolemia 06/29/2015   Allergic rhinitis 07/16/2009   Essential (primary) hypertension 07/16/2009   Genital herpes 02/10/2009    Past Surgical History:  Procedure Laterality Date   BREAST BIOPSY Right 05/01/2019   2 axilla LN bx path pending    DILATION AND CURETTAGE OF UTERUS     ENDOMETRIAL ABLATION     Laparoscopic supracervical hysterectomy bilateral salpingectomy  2015   and BS- with [facility] - [facility]   PELVIC LAPAROSCOPY      OB History     Gravida  3   Para  2   Term  2   Preterm      AB  1   Living  2      SAB      IAB  1   Ectopic      Multiple      Live Births  2            Home  Medications    Prior to Admission medications   Medication Sig Start Date End Date Taking? Authorizing Provider  amoxicillin (AMOXIL) 875 MG tablet Take 1 tablet (875 mg total) by mouth 2 (two) times daily for 7 days. 07/14/21 07/21/21 Yes Sharion Balloon, NP  fluconazole (DIFLUCAN) 150 MG tablet Take 1 tablet (150 mg total) by mouth daily. Take one tablet today.  May repeat in 3 days. 07/14/21  Yes Sharion Balloon, NP  acyclovir (ZOVIRAX) 400 MG tablet TAKE 1 TABLET(400 MG) BY MOUTH TWICE DAILY 04/08/21   Jerrol Banana., MD  buPROPion (WELLBUTRIN XL) 150 MG 24 hr tablet TAKE 1 TABLET(150 MG) BY MOUTH DAILY 06/16/21   Jerrol Banana., MD  cyclobenzaprine (FLEXERIL) 5 MG tablet TAKE 1 TABLET(5 MG) BY MOUTH THREE TIMES DAILY AS NEEDED FOR MUSCLE SPASMS 09/12/19   Fenton Malling M, PA-C  fluticasone (FLONASE) 50 MCG/ACT nasal spray Place 2 sprays into both nostrils daily. 04/17/18   Carmon Ginsberg, PA  gabapentin (NEURONTIN) 100 MG capsule TAKE 1 CAPSULE(100 MG) BY MOUTH THREE TIMES DAILY 03/04/20   Mar Daring, PA-C  sertraline (ZOLOFT) 50 MG tablet TAKE 1 TABLET(50 MG) BY MOUTH DAILY 06/16/21   Eulas Post  Brooke Bonito., MD    Family History Family History  Problem Relation Age of Onset   Hypertension Mother    Depression Mother    Bipolar disorder Mother    Diabetes Mother    Diabetes Maternal Aunt    Diabetes Maternal Uncle    Breast cancer Neg Hx    Ovarian cancer Neg Hx    Colon cancer Neg Hx    Heart disease Neg Hx     Social History Social History   Tobacco Use   Smoking status: Never   Smokeless tobacco: Never  Vaping Use   Vaping Use: Never used  Substance Use Topics   Alcohol use: Yes    Alcohol/week: 0.0 standard drinks    Comment: occas   Drug use: No    Comment: marijuana use twice this past weekend     Allergies   Patient has no known allergies.   Review of Systems Review of Systems  Constitutional:  Positive for fatigue. Negative for  chills and fever.  HENT:  Positive for congestion, ear pain and sore throat.   Respiratory:  Positive for cough, shortness of breath and wheezing.   Cardiovascular:  Negative for chest pain and palpitations.  Gastrointestinal:  Negative for abdominal pain, diarrhea and vomiting.  Skin:  Negative for color change and rash.  Neurological:  Positive for headaches. Negative for dizziness and syncope.  All other systems reviewed and are negative.   Physical Exam Triage Vital Signs ED Triage Vitals  Enc Vitals Group     BP      Pulse      Resp      Temp      Temp src      SpO2      Weight      Height      Head Circumference      Peak Flow      Pain Score      Pain Loc      Pain Edu?      Excl. in [place]?    No data found.  Updated Vital Signs BP (!) 133/94 (BP Location: Left Arm)   Pulse 89   Temp 97.9 F (36.6 C)   Resp 18   SpO2 98%   Visual Acuity Right Eye Distance:   Left Eye Distance:   Bilateral Distance:    Right Eye Near:   Left Eye Near:    Bilateral Near:     Physical Exam Vitals and nursing note reviewed.  Constitutional:      General: She is not in acute distress.    Appearance: She is well-developed.  HENT:     Head: Normocephalic and atraumatic.     Right Ear: Tympanic membrane normal.     Left Ear: Tympanic membrane normal.     Nose: Nose normal.     Mouth/Throat:     Mouth: Mucous membranes are moist.     Pharynx: Oropharynx is clear.  Eyes:     Conjunctiva/sclera: Conjunctivae normal.  Cardiovascular:     Rate and Rhythm: Normal rate and regular rhythm.     Heart sounds: Normal heart sounds.  Pulmonary:     Effort: Pulmonary effort is normal. No respiratory distress.     Breath sounds: Normal breath sounds.  Abdominal:     Palpations: Abdomen is soft.     Tenderness: There is no abdominal tenderness.  Musculoskeletal:     Cervical back: Neck supple.  Skin:    General: Skin is  warm and dry.  Neurological:     General: No focal  deficit present.     Mental Status: She is alert and oriented to person, place, and time.     Gait: Gait normal.  Psychiatric:        Mood and Affect: Mood normal.        Behavior: Behavior normal.     UC Treatments / Results  Labs (all labs ordered are listed, but only abnormal results are displayed) Labs Reviewed - No data to display  EKG   Radiology DG Chest 2 View  Result Date: 07/14/2021 CLINICAL DATA:  Cough for 2 weeks.  Recent Street of COVID infection EXAM: CHEST - 2 VIEW COMPARISON:  03/12/2012 FINDINGS: The heart size and mediastinal contours are within normal limits. Both lungs are clear. The visualized skeletal structures are unremarkable. IMPRESSION: No active cardiopulmonary disease. Electronically Signed   By: Davina Poke D.O.   On: 07/14/2021 09:11    Procedures Procedures (including critical care time)  Medications Ordered in UC Medications - No data to display  Initial Impression / Assessment and Plan / UC Course  I have reviewed the triage vital signs and the nursing notes.  Pertinent labs & imaging results that were available during my care of the patient were reviewed by me and considered in my medical decision making (see chart for details).  COVID-19, acute sinusitis, elevated blood pressure with hypertension.  Patient has been symptomatic for 2 weeks.  No respiratory distress, O2 sat 98% on room air.  Chest x-ray negative.  Treating sinus infection with amoxicillin.  Per patient request, 1 tablet of Diflucan also prescribed.  ED precautions discussed.  Instructed patient to follow-up with her PCP if her symptoms are not improving.  Also discussed that her blood pressure is elevated today and needs to be rechecked by her PCP in 2 to 4 weeks.  She agrees to plan of care.   Final Clinical Impressions(s) / UC Diagnoses   Final diagnoses:  COVID-19  Acute non-recurrent maxillary sinusitis  Elevated blood pressure reading in office with diagnosis of  hypertension     Discharge Instructions      Take the amoxicillin and Diflucan as directed.  Go to the emergency department if you have acute shortness of breath or other concerning symptoms.    Your blood pressure is elevated today at 133/94.  Please have this rechecked by your primary care provider in 2-4 weeks.          ED Prescriptions     Medication Sig Dispense Auth. Provider   amoxicillin (AMOXIL) 875 MG tablet Take 1 tablet (875 mg total) by mouth 2 (two) times daily for 7 days. 14 tablet Sharion Balloon, NP   fluconazole (DIFLUCAN) 150 MG tablet Take 1 tablet (150 mg total) by mouth daily. Take one tablet today.  May repeat in 3 days. 1 tablet Sharion Balloon, NP      PDMP not reviewed this encounter.   Sharion Balloon, NP 07/14/21 [phone]

## 2021-07-14 NOTE — Discharge Instructions (Addendum)
Take the amoxicillin and Diflucan as directed.  Go to the emergency department if you have acute shortness of breath or other concerning symptoms.    Your blood pressure is elevated today at 133/94.  Please have this rechecked by your primary care provider in 2-4 weeks.

## 2021-11-29 ENCOUNTER — Telehealth: Payer: No Typology Code available for payment source | Admitting: Physician Assistant

## 2021-11-29 DIAGNOSIS — B3731 Acute candidiasis of vulva and vagina: Secondary | ICD-10-CM

## 2021-11-29 MED ORDER — FLUCONAZOLE 150 MG PO TABS: 150.0000 mg | ORAL_TABLET | Freq: Once | ORAL | 0 refills | Status: AC

## 2021-11-29 NOTE — Progress Notes (Signed)

## 2021-12-05 ENCOUNTER — Other Ambulatory Visit: Payer: Self-pay | Admitting: Family Medicine

## 2021-12-05 DIAGNOSIS — F32A Depression, unspecified: Secondary | ICD-10-CM

## 2021-12-13 NOTE — Progress Notes (Deleted)
?  ? ? ?  Established patient visit ? ? ?Patient: Daisy Foley   DOB: August 12, 1981   41 y.o. Female  MRN: [id number] ?Visit Date: 12/14/2021 ? ?Today's healthcare provider: [name], MD  ? ?No chief complaint on file. ? ?Subjective  ?  ?HPI  ?Depression, Follow-up ? ?She  was last seen for this 6 months ago. ?Changes made at last visit include; Complete remission presently on sertraline and Wellbutrin. Follow-up in 6 months. ?  ?She reports {excellent/good/fair/poor:19665} compliance with treatment. ?She {is/is not:21021397} having side effects. *** ? ?She reports {DESC; GOOD/FAIR/POOR:18685} tolerance of treatment. ?Current symptoms include: {Symptoms; depression:1002} ?She feels she is {improved/worse/unchanged:3041574} since last visit. ? ? ?  06/08/2021  ?  4:28 PM 04/07/2021  ?  4:08 PM 03/10/2020  ?  4:37 PM  ?Depression screen PHQ 2/9  ?Decreased Interest '1 2 1  '$ ?Down, Depressed, Hopeless '1 1 1  '$ ?PHQ - 2 Score '2 3 2  '$ ?Altered sleeping 0 0 0  ?Tired, decreased energy '1 2 2  '$ ?Change in appetite '1 1 2  '$ ?Feeling bad or failure about yourself  0 0 2  ?Trouble concentrating 0 3 0  ?Moving slowly or fidgety/restless 0 0 0  ?Suicidal thoughts 0 0 0  ?PHQ-9 Score '4 9 8  '$ ?Difficult doing work/chores Not difficult at all Somewhat difficult Somewhat difficult  ?  ?----------------------------------------------------------------------------------------- ? ? ?Medications: ?Outpatient Medications Prior to Visit  ?Medication Sig  ? acyclovir (ZOVIRAX) 400 MG tablet TAKE 1 TABLET(400 MG) BY MOUTH TWICE DAILY  ? buPROPion (WELLBUTRIN XL) 150 MG 24 hr tablet TAKE 1 TABLET(150 MG) BY MOUTH DAILY  ? cyclobenzaprine (FLEXERIL) 5 MG tablet TAKE 1 TABLET(5 MG) BY MOUTH THREE TIMES DAILY AS NEEDED FOR MUSCLE SPASMS  ? fluticasone (FLONASE) 50 MCG/ACT nasal spray Place 2 sprays into both nostrils daily.  ? gabapentin (NEURONTIN) 100 MG capsule TAKE 1 CAPSULE(100 MG) BY MOUTH THREE TIMES DAILY  ? sertraline (ZOLOFT) 50 MG tablet  TAKE 1 TABLET(50 MG) BY MOUTH DAILY  ? ?No facility-administered medications prior to visit.  ? ? ?Review of Systems  ?Constitutional:  Negative for appetite change, chills, fatigue and fever.  ?Respiratory:  Negative for chest tightness and shortness of breath.   ?Cardiovascular:  Negative for chest pain and palpitations.  ?Gastrointestinal:  Negative for abdominal pain, nausea and vomiting.  ?Neurological:  Negative for dizziness and weakness.  ? ?{Labs  Heme  Chem  Endocrine  Serology  Results Review (optional):23779} ?  Objective  ?  ?There were no vitals taken for this visit. ?{Show previous vital signs (optional):23777} ? ?Physical Exam  ?*** ? ?No results found for any visits on 12/14/21. ? Assessment & Plan  ?  ? ?*** ? ?No follow-ups on file.  ?   ? ?{provider attestation***:1} ? ? ?Richard Cranford Mon, MD  ?[facility] ?[phone] (phone) ?[phone] (fax) ? ?[facility] ?

## 2021-12-14 ENCOUNTER — Ambulatory Visit: Payer: No Typology Code available for payment source | Admitting: Family Medicine

## 2021-12-14 DIAGNOSIS — F339 Major depressive disorder, recurrent, unspecified: Secondary | ICD-10-CM

## 2022-01-31 ENCOUNTER — Ambulatory Visit: Payer: No Typology Code available for payment source | Admitting: Family Medicine

## 2022-02-01 ENCOUNTER — Ambulatory Visit (INDEPENDENT_AMBULATORY_CARE_PROVIDER_SITE_OTHER): Payer: No Typology Code available for payment source | Admitting: Family Medicine

## 2022-02-01 ENCOUNTER — Encounter: Payer: Self-pay | Admitting: Family Medicine

## 2022-02-01 VITALS — BP 109/76 | HR 83 | Resp 16 | Wt 252.0 lb

## 2022-02-01 DIAGNOSIS — F32A Depression, unspecified: Secondary | ICD-10-CM | POA: Diagnosis not present

## 2022-02-01 DIAGNOSIS — M542 Cervicalgia: Secondary | ICD-10-CM

## 2022-02-01 DIAGNOSIS — M25511 Pain in right shoulder: Secondary | ICD-10-CM

## 2022-02-01 DIAGNOSIS — F339 Major depressive disorder, recurrent, unspecified: Secondary | ICD-10-CM

## 2022-02-01 DIAGNOSIS — H9203 Otalgia, bilateral: Secondary | ICD-10-CM | POA: Diagnosis not present

## 2022-02-01 DIAGNOSIS — H9313 Tinnitus, bilateral: Secondary | ICD-10-CM

## 2022-02-01 DIAGNOSIS — G8929 Other chronic pain: Secondary | ICD-10-CM

## 2022-02-01 DIAGNOSIS — R590 Localized enlarged lymph nodes: Secondary | ICD-10-CM

## 2022-02-01 MED ORDER — SERTRALINE HCL 50 MG PO TABS: ORAL_TABLET | ORAL | 3 refills | Status: AC

## 2022-02-01 MED ORDER — BUPROPION HCL ER (XL) 150 MG PO TB24: 150.0000 mg | ORAL_TABLET | Freq: Every day | ORAL | 2 refills | Status: AC

## 2022-02-01 MED ORDER — GABAPENTIN 100 MG PO CAPS: ORAL_CAPSULE | ORAL | 1 refills | Status: AC

## 2022-02-01 MED ORDER — CYCLOBENZAPRINE HCL 5 MG PO TABS: ORAL_TABLET | ORAL | 10 refills | Status: AC

## 2022-02-01 MED ORDER — FLUTICASONE PROPIONATE 50 MCG/ACT NA SUSP
2.0000 | Freq: Every day | NASAL | 3 refills | Status: AC
Start: 2022-02-01 — End: ?

## 2022-02-01 MED ORDER — ACYCLOVIR 400 MG PO TABS: ORAL_TABLET | ORAL | 3 refills | Status: AC

## 2022-02-01 NOTE — Progress Notes (Signed)
Established patient visit  I,[name],acting as a scribe for [name], MD.,have documented all relevant documentation on the behalf of [name], MD,as directed by  [name], MD while in the presence of [name], MD.   Patient: Daisy Foley   DOB: April 09, 1981   41 y.o. Female  MRN: [id number] Visit Date: 02/01/2022  Today's healthcare provider: [name], MD   Chief Complaint  Patient presents with   Follow-up   Depression   Subjective    HPI  Patient comes in today for follow-up.  She is doing okay.  She definitely needs to be on her medications for depression.  She is depressed and somewhat anxious.  She is enjoying her work with [place] [employer] and everything is going well. She has a little knot on the back left neck.  Is been there for a week or 2.  No pain no fevers.  She thinks she has had a recent URI.  Depression, Follow-up  She  was last seen for this 6 months ago. Changes made at last visit include; Complete remission presently on sertraline and Wellbutrin.     02/01/2022    2:48 PM 06/08/2021    4:28 PM 04/07/2021    4:08 PM  Depression screen PHQ 2/9  Decreased Interest '1 1 2  '$ Down, Depressed, Hopeless '1 1 1  '$ PHQ - 2 Score '2 2 3  '$ Altered sleeping 1 0 0  Tired, decreased energy '1 1 2  '$ Change in appetite '1 1 1  '$ Feeling bad or failure about yourself  1 0 0  Trouble concentrating 1 0 3  Moving slowly or fidgety/restless 0 0 0  Suicidal thoughts 0 0 0  PHQ-9 Score '7 4 9  '$ Difficult doing work/chores Somewhat difficult Not difficult at all Somewhat difficult    -----------------------------------------------------------------------------------------   Medications: Outpatient Medications Prior to Visit  Medication Sig   acyclovir (ZOVIRAX) 400 MG tablet TAKE 1 TABLET(400 MG) BY MOUTH TWICE DAILY   buPROPion (WELLBUTRIN XL) 150 MG 24 hr tablet TAKE 1 TABLET(150 MG) BY MOUTH DAILY   cyclobenzaprine  (FLEXERIL) 5 MG tablet TAKE 1 TABLET(5 MG) BY MOUTH THREE TIMES DAILY AS NEEDED FOR MUSCLE SPASMS   fluticasone (FLONASE) 50 MCG/ACT nasal spray Place 2 sprays into both nostrils daily.   gabapentin (NEURONTIN) 100 MG capsule TAKE 1 CAPSULE(100 MG) BY MOUTH THREE TIMES DAILY   sertraline (ZOLOFT) 50 MG tablet TAKE 1 TABLET(50 MG) BY MOUTH DAILY   No facility-administered medications prior to visit.    Review of Systems  Constitutional:  Negative for appetite change, chills, fatigue and fever.  Respiratory:  Negative for chest tightness and shortness of breath.   Cardiovascular:  Negative for chest pain and palpitations.  Gastrointestinal:  Negative for abdominal pain, nausea and vomiting.  Neurological:  Negative for dizziness and weakness.   Last CBC Lab Results  Component Value Date   WBC 4.1 04/03/2019   HGB 14.2 04/03/2019   HCT 42.4 04/03/2019   MCV 87 04/03/2019   MCH 29.0 04/03/2019   RDW 12.5 04/03/2019   PLT 217 04/03/2019   Last vitamin D No results found for: 25OHVITD2, 25OHVITD3, VD25OH     Objective    BP 109/76 (BP Location: Right Arm, Patient Position: Sitting, Cuff Size: Large)   Pulse 83   Resp 16   Wt 252 lb (114.3 kg)   SpO2 99%   BMI 34.18 kg/m  BP Readings from Last  3 Encounters:  02/01/22 109/76  07/14/21 (!) 133/94  06/08/21 123/85   Wt Readings from Last 3 Encounters:  02/01/22 252 lb (114.3 kg)  06/08/21 244 lb (110.7 kg)  04/13/21 245 lb (111.1 kg)      Physical Exam Vitals reviewed.  Constitutional:      General: She is not in acute distress.    Appearance: She is well-developed.  HENT:     Head: Normocephalic and atraumatic.     Right Ear: Hearing normal.     Left Ear: Hearing normal.     Nose: Nose normal.  Eyes:     General: Lids are normal. No scleral icterus.       Right eye: No discharge.        Left eye: No discharge.     Conjunctiva/sclera: Conjunctivae normal.  Neck:     Comments: Single nontender mildly enlarged  lymph node in the left posterior chain.  It is mobile nonfluctuant, consistency Cardiovascular:     Rate and Rhythm: Normal rate and regular rhythm.     Heart sounds: Normal heart sounds.  Pulmonary:     Effort: Pulmonary effort is normal. No respiratory distress.  Musculoskeletal:     Cervical back: Neck supple. No tenderness.  Lymphadenopathy:     Cervical: Cervical adenopathy present.  Skin:    Findings: No lesion or rash.  Neurological:     General: No focal deficit present.     Mental Status: She is alert and oriented to person, place, and time.  Psychiatric:        Mood and Affect: Mood normal.        Speech: Speech normal.        Behavior: Behavior normal.        Thought Content: Thought content normal.        Judgment: Judgment normal.      No results found for any visits on 02/01/22.  Assessment & Plan     1. Depression, recurrent (HCC) Refill Wellbutrin and Zoloft.  2. Depression, unspecified depression type  - buPROPion (WELLBUTRIN XL) 150 MG 24 hr tablet; Take 1 tablet (150 mg total) by mouth daily.  Dispense: 90 tablet; Refill: 2 - sertraline (ZOLOFT) 50 MG tablet; TAKE 1 TABLET(50 MG) BY MOUTH DAILY  Dispense: 90 tablet; Refill: 3  3. Trigger point of right shoulder region Chronic issue helped by meds - cyclobenzaprine (FLEXERIL) 5 MG tablet; TAKE 1 TABLET(5 MG) BY MOUTH THREE TIMES DAILY AS NEEDED FOR MUSCLE SPASMS  Dispense: 30 tablet; Refill: 10 - gabapentin (NEURONTIN) 100 MG capsule; TAKE 1 CAPSULE(100 MG) BY MOUTH THREE TIMES DAILY  Dispense: 360 capsule; Refill: 1  4. Otalgia of both ears Probable source of her adenopathy - fluticasone (FLONASE) 50 MCG/ACT nasal spray; Place 2 sprays into both nostrils daily.  Dispense: 16 g; Refill: 3  5. Tinnitus of both ears  - fluticasone (FLONASE) 50 MCG/ACT nasal spray; Place 2 sprays into both nostrils daily.  Dispense: 16 g; Refill: 3  6. Neck pain/  - gabapentin (NEURONTIN) 100 MG capsule; TAKE 1  CAPSULE(100 MG) BY MOUTH THREE TIMES DAILY  Dispense: 360 capsule; Refill: 1  7. Chronic right shoulder pain  - gabapentin (NEURONTIN) 100 MG capsule; TAKE 1 CAPSULE(100 MG) BY MOUTH THREE TIMES DAILY  Dispense: 360 capsule; Refill: 1 8.  Left posterior lymphadenopathy Follow-up in a couple of months.  I think recent URI as etiology of this.  No follow-ups on file.  I, [name], MD, have reviewed all documentation for this visit. The documentation on 02/05/22 for the exam, diagnosis, procedures, and orders are all accurate and complete.    Niyana Chesbro Cranford Mon, MD  [facility] [phone] (phone) [phone] (fax)  [place]

## 2022-02-09 ENCOUNTER — Encounter: Payer: Self-pay | Admitting: Obstetrics and Gynecology

## 2022-04-07 ENCOUNTER — Ambulatory Visit: Payer: No Typology Code available for payment source | Admitting: Family Medicine

## 2022-04-16 ENCOUNTER — Telehealth: Payer: Self-pay | Admitting: Urgent Care

## 2022-04-16 DIAGNOSIS — N760 Acute vaginitis: Secondary | ICD-10-CM

## 2022-04-16 MED ORDER — FLUCONAZOLE 150 MG PO TABS: 150.0000 mg | ORAL_TABLET | Freq: Every day | ORAL | 0 refills | Status: DC

## 2022-04-16 MED ORDER — METRONIDAZOLE 500 MG PO TABS: 500.0000 mg | ORAL_TABLET | Freq: Two times a day (BID) | ORAL | 0 refills | Status: AC

## 2022-04-16 NOTE — Progress Notes (Signed)
E-Visit for Vaginal Symptoms  We are sorry that you are not feeling well. Here is how we plan to help! Based on what you shared with me it looks like you: May have a vaginosis due to bacteria  Vaginosis is an inflammation of the vagina that can result in discharge, itching and pain. The cause is usually a change in the normal balance of vaginal bacteria or an infection. Vaginosis can also result from reduced estrogen levels after menopause.  The most common causes of vaginosis are:   Bacterial vaginosis which results from an overgrowth of one on several organisms that are normally present in your vagina.   Yeast infections which are caused by a naturally occurring fungus called candida.   Vaginal atrophy (atrophic vaginosis) which results from the thinning of the vagina from reduced estrogen levels after menopause.   Trichomoniasis which is caused by a parasite and is commonly transmitted by sexual intercourse.  Factors that increase your risk of developing vaginosis include: Medications, such as antibiotics and steroids Uncontrolled diabetes Use of hygiene products such as bubble bath, vaginal spray or vaginal deodorant Douching Wearing damp or tight-fitting clothing Using an intrauterine device (IUD) for birth control Hormonal changes, such as those associated with pregnancy, birth control pills or menopause Sexual activity Having a sexually transmitted infection  Your treatment plan is Metronidazole or Flagyl '500mg'$  twice a day for 7 days.  I have electronically sent this prescription into the pharmacy that you have chosen. I have also sent in a single tablet of diflucan for you to take. One tab by mouth after completion of antibiotics.  Be sure to take all of the medication as directed. Stop taking any medication if you develop a rash, tongue swelling or shortness of breath. Mothers who are breast feeding should consider pumping and discarding their breast milk while on these  antibiotics. However, there is no consensus that infant exposure at these doses would be harmful.  Remember that medication creams can weaken latex condoms. Marland Kitchen   HOME CARE:  Good hygiene may prevent some types of vaginosis from recurring and may relieve some symptoms:  Avoid baths, hot tubs and whirlpool spas. Rinse soap from your outer genital area after a shower, and dry the area well to prevent irritation. Don't use scented or harsh soaps, such as those with deodorant or antibacterial action. Avoid irritants. These include scented tampons and pads. Wipe from front to back after using the toilet. Doing so avoids spreading fecal bacteria to your vagina.  Other things that may help prevent vaginosis include:  Don't douche. Your vagina doesn't require cleansing other than normal bathing. Repetitive douching disrupts the normal organisms that reside in the vagina and can actually increase your risk of vaginal infection. Douching won't clear up a vaginal infection. Use a latex condom. Both female and female latex condoms may help you avoid infections spread by sexual contact. Wear cotton underwear. Also wear pantyhose with a cotton crotch. If you feel comfortable without it, skip wearing underwear to bed. Yeast thrives in [employer] Your symptoms should improve in the next day or two.  GET HELP RIGHT AWAY IF:  You have pain in your lower abdomen ( pelvic area or over your ovaries) You develop nausea or vomiting You develop a fever Your discharge changes or worsens You have persistent pain with intercourse You develop shortness of breath, a rapid pulse, or you faint.  These symptoms could be signs of problems or infections that need to be evaluated  by a medical provider now.  MAKE SURE YOU   Understand these instructions. Will watch your condition. Will get help right away if you are not doing well or get worse.  Thank you for choosing an e-visit.  Your e-visit answers were  reviewed by a board certified advanced clinical practitioner to complete your personal care plan. Depending upon the condition, your plan could have included both over the counter or prescription medications.  Please review your pharmacy choice. Make sure the pharmacy is open so you can pick up prescription now. If there is a problem, you may contact your provider through [employer] and have the prescription routed to another pharmacy.  Your safety is important to [place]. If you have drug allergies check your prescription carefully.   For the next 24 hours you can use MyChart to ask questions about today's visit, request a non-urgent call back, or ask for a work or school excuse. You will get an email in the next two days asking about your experience. I hope that your e-visit has been valuable and will speed your recovery.   I have spent 5 minutes in review of e-visit questionnaire, review and updating patient chart, medical decision making and response to patient.   [place], PA

## 2022-04-18 ENCOUNTER — Encounter: Payer: No Typology Code available for payment source | Admitting: Obstetrics and Gynecology

## 2022-05-03 ENCOUNTER — Telehealth: Payer: No Typology Code available for payment source | Admitting: Physician Assistant

## 2022-05-03 DIAGNOSIS — N3090 Cystitis, unspecified without hematuria: Secondary | ICD-10-CM

## 2022-05-03 MED ORDER — CEPHALEXIN 500 MG PO CAPS: 500.0000 mg | ORAL_CAPSULE | Freq: Four times a day (QID) | ORAL | 0 refills | Status: AC

## 2022-05-03 NOTE — Progress Notes (Signed)

## 2022-05-12 ENCOUNTER — Encounter: Payer: Self-pay | Admitting: Obstetrics and Gynecology

## 2022-05-12 ENCOUNTER — Ambulatory Visit (INDEPENDENT_AMBULATORY_CARE_PROVIDER_SITE_OTHER): Payer: No Typology Code available for payment source | Admitting: Obstetrics and Gynecology

## 2022-05-12 VITALS — BP 124/83 | HR 89 | Ht 72.0 in | Wt 243.9 lb

## 2022-05-12 DIAGNOSIS — Z01419 Encounter for gynecological examination (general) (routine) without abnormal findings: Secondary | ICD-10-CM | POA: Diagnosis not present

## 2022-05-12 NOTE — Progress Notes (Signed)
HPI:      [name] is a 41 y.o. [id number] who LMP was No LMP recorded. Patient has had a hysterectomy.  Subjective:   She presents today for her annual examination.  She is not having any problems at this time.  She reports that the pain she had last year has gradually resolved.  She is currently being treated for UTI but she says it is much better.    Hx: The following portions of the patient's history were reviewed and updated as appropriate:             She  has a past medical history of Endometriosis/adenomyosis, Fibroid, and Genital herpes. She does not have any pertinent problems on file. She  has a past surgical history that includes Endometrial ablation; Dilation and curettage of uterus; Pelvic laparoscopy; Laparoscopic supracervical hysterectomy bilateral salpingectomy (2015); and Breast biopsy (Right, 05/01/2019). Her family history includes Bipolar disorder in her mother; Depression in her mother; Diabetes in her maternal aunt, maternal uncle, and mother; Hypertension in her mother. She  reports that she has never smoked. She has never used smokeless tobacco. She reports current alcohol use. She reports that she does not use drugs. She has a current medication list which includes the following prescription(s): acyclovir, bupropion, cyclobenzaprine, fluticasone, gabapentin, and sertraline. She has No Known Allergies.       Review of Systems:  Review of Systems  Constitutional: Denied constitutional symptoms, night sweats, recent illness, fatigue, fever, insomnia and weight loss.  Eyes: Denied eye symptoms, eye pain, photophobia, vision change and visual disturbance.  Ears/Nose/Throat/Neck: Denied ear, nose, throat or neck symptoms, hearing loss, nasal discharge, sinus congestion and sore throat.  Cardiovascular: Denied cardiovascular symptoms, arrhythmia, chest pain/pressure, edema, exercise intolerance, orthopnea and palpitations.  Respiratory: Denied pulmonary symptoms,  asthma, pleuritic pain, productive sputum, cough, dyspnea and wheezing.  Gastrointestinal: Denied, gastro-esophageal reflux, melena, nausea and vomiting.  Genitourinary: Denied genitourinary symptoms including symptomatic vaginal discharge, pelvic relaxation issues, and urinary complaints.  Musculoskeletal: Denied musculoskeletal symptoms, stiffness, swelling, muscle weakness and myalgia.  Dermatologic: Denied dermatology symptoms, rash and scar.  Neurologic: Denied neurology symptoms, dizziness, headache, neck pain and syncope.  Psychiatric: Denied psychiatric symptoms, anxiety and depression.  Endocrine: Denied endocrine symptoms including hot flashes and night sweats.   Meds:   Current Outpatient Medications on File Prior to Visit  Medication Sig Dispense Refill   acyclovir (ZOVIRAX) 400 MG tablet TAKE 1 TABLET(400 MG) BY MOUTH TWICE DAILY 90 tablet 3   buPROPion (WELLBUTRIN XL) 150 MG 24 hr tablet Take 1 tablet (150 mg total) by mouth daily. 90 tablet 2   cyclobenzaprine (FLEXERIL) 5 MG tablet TAKE 1 TABLET(5 MG) BY MOUTH THREE TIMES DAILY AS NEEDED FOR MUSCLE SPASMS 30 tablet 10   fluticasone (FLONASE) 50 MCG/ACT nasal spray Place 2 sprays into both nostrils daily. 16 g 3   gabapentin (NEURONTIN) 100 MG capsule TAKE 1 CAPSULE(100 MG) BY MOUTH THREE TIMES DAILY 360 capsule 1   sertraline (ZOLOFT) 50 MG tablet TAKE 1 TABLET(50 MG) BY MOUTH DAILY 90 tablet 3   No current facility-administered medications on file prior to visit.     Objective:     Vitals:   05/12/22 1113  BP: 124/83  Pulse: 89    Filed Weights   05/12/22 1113  Weight: 243 lb 14.4 oz (110.6 kg)              Physical examination General NAD, Conversant  HEENT Atraumatic; Op clear with mmm.  Normo-cephalic. Pupils reactive. Anicteric sclerae  Thyroid/Neck Smooth without nodularity or enlargement. Normal ROM.  Neck Supple.  Skin No rashes, lesions or ulceration. Normal palpated skin turgor. No nodularity.   Breasts: No masses or discharge.  Symmetric.  No axillary adenopathy.  Lungs: Clear to auscultation.No rales or wheezes. Normal Respiratory effort, no retractions.  Heart: NSR.  No murmurs or rubs appreciated. No periferal edema  Abdomen: Soft.  Non-tender.  No masses.  No HSM. No hernia  Extremities: Moves all appropriately.  Normal ROM for age. No lymphadenopathy.  Neuro: Oriented to PPT.  Normal mood. Normal affect.     Pelvic:   Vulva: Normal appearance.  No lesions.   Vagina: No lesions or abnormalities noted.  Support: Normal pelvic support.  Urethra No masses tenderness or scarring.  Meatus Normal size without lesions or prolapse.  Cervix: Surgically absent   Anus: Normal exam.  No lesions.  Perineum: Normal exam.  No lesions.        Bimanual   Uterus: Surgically absent   Adnexae: No masses.  Non-tender to palpation.  Cul-de-sac: Negative for abnormality.     Assessment:    [id number] Patient Active Problem List   Diagnosis Date Noted   Foot trauma, left, subsequent encounter 05/23/2018   Abnormal vaginal bleeding 01/09/2017   STD exposure 01/09/2017   ADD (attention deficit disorder) 06/29/2015   Clinical depression 06/29/2015   Esophagitis, reflux 06/29/2015   Adult hypothyroidism 06/29/2015   Headache, migraine 06/29/2015   Pure hypercholesterolemia 06/29/2015   Allergic rhinitis 07/16/2009   Essential (primary) hypertension 07/16/2009   Genital herpes 02/10/2009     1. Well woman exam with routine gynecological exam     Normal exam   Plan:            1.  Basic Screening Recommendations The basic screening recommendations for asymptomatic women were discussed with the patient during her visit.  The age-appropriate recommendations were discussed with her and the rational for the tests reviewed.  When I am informed by the patient that another primary care physician has previously obtained the age-appropriate tests and they are up-to-date, only outstanding  tests are ordered and referrals given as necessary.  Abnormal results of tests will be discussed with her when all of her results are completed.  Routine preventative health maintenance measures emphasized: Exercise/Diet/Weight control, Tobacco Warnings, Alcohol/Substance use risks and Stress Management  Mammogram next year Orders Orders Placed This Encounter  Procedures   Basic metabolic panel   CBC   TSH   Lipid panel   Hemoglobin A1c    No orders of the defined types were placed in this encounter.         F/U  Return in about 1 year (around 05/13/2023) for Annual Physical.  Finis Bud, M.D. 05/12/2022 11:54 AM

## 2022-05-12 NOTE — Progress Notes (Signed)
Patients presents for annual exam today. She states is doing well. Patient states 2 days of brown discharge with strong odor in June, resolved within 2 days. History of hysterectomy due to endometriosis, no bleeding since. Patients annual labs are ordered. Patient states no other questions or concerns at this time.

## 2022-05-13 LAB — CBC
Hematocrit: 42.8 % (ref 34.0–46.6)
Hemoglobin: 13.7 g/dL (ref 11.1–15.9)
MCH: 28.2 pg (ref 26.6–33.0)
MCHC: 32 g/dL (ref 31.5–35.7)
MCV: 88 fL (ref 79–97)
Platelets: 220 10*3/uL (ref 150–450)
RBC: 4.86 x10E6/uL (ref 3.77–5.28)
RDW: 13.1 % (ref 11.7–15.4)
WBC: 4.7 10*3/uL (ref 3.4–10.8)

## 2022-05-13 LAB — BASIC METABOLIC PANEL
BUN/Creatinine Ratio: 11 (ref 9–23)
BUN: 8 mg/dL (ref 6–20)
CO2: 22 mmol/L (ref 20–29)
Calcium: 9.4 mg/dL (ref 8.7–10.2)
Chloride: 102 mmol/L (ref 96–106)
Creatinine, Ser: 0.71 mg/dL (ref 0.57–1.00)
Glucose: 91 mg/dL (ref 70–99)
Potassium: 4.5 mmol/L (ref 3.5–5.2)
Sodium: 138 mmol/L (ref 134–144)
eGFR: 111 mL/min/{1.73_m2} (ref >59)

## 2022-05-13 LAB — LIPID PANEL
Chol/HDL Ratio: 5.2 ratio — ABNORMAL HIGH (ref 0.0–4.4)
Cholesterol, Total: 161 mg/dL (ref 100–199)
HDL: 31 mg/dL — ABNORMAL LOW (ref >39)
LDL Chol Calc (NIH): 115 mg/dL — ABNORMAL HIGH (ref 0–99)
Triglycerides: 76 mg/dL (ref 0–149)
VLDL Cholesterol Cal: 15 mg/dL (ref 5–40)

## 2022-05-13 LAB — HEMOGLOBIN A1C
Est. average glucose Bld gHb Est-mCnc: 120 mg/dL
Hgb A1c MFr Bld: 5.8 % — ABNORMAL HIGH (ref 4.8–5.6)

## 2022-05-13 LAB — TSH: TSH: 1.81 u[IU]/mL (ref 0.450–4.500)

## 2022-05-16 NOTE — Progress Notes (Signed)
Your A1C shows that you may be pre-diabetic.  It would be a good idea to talk with your Family doc or internist about this.

## 2022-06-29 ENCOUNTER — Telehealth: Payer: No Typology Code available for payment source | Admitting: Physician Assistant

## 2022-06-29 DIAGNOSIS — R3989 Other symptoms and signs involving the genitourinary system: Secondary | ICD-10-CM | POA: Diagnosis not present

## 2022-06-29 DIAGNOSIS — T3695XA Adverse effect of unspecified systemic antibiotic, initial encounter: Secondary | ICD-10-CM

## 2022-06-29 DIAGNOSIS — B379 Candidiasis, unspecified: Secondary | ICD-10-CM | POA: Diagnosis not present

## 2022-06-29 MED ORDER — FLUCONAZOLE 150 MG PO TABS
150.0000 mg | ORAL_TABLET | ORAL | 0 refills | Status: DC | PRN
Start: 2022-06-29 — End: 2022-08-17

## 2022-06-29 MED ORDER — SULFAMETHOXAZOLE-TRIMETHOPRIM 800-160 MG PO TABS: 1.0000 | ORAL_TABLET | Freq: Two times a day (BID) | ORAL | 0 refills | Status: DC

## 2022-06-29 NOTE — Progress Notes (Signed)
E-Visit for Urinary Problems  We are sorry that you are not feeling well.  Here is how we plan to help!  Based on what you shared with me it looks like you most likely have a simple urinary tract infection.  A UTI (Urinary Tract Infection) is a bacterial infection of the bladder.  Most cases of urinary tract infections are simple to treat but a key part of your care is to encourage you to drink plenty of fluids and watch your symptoms carefully.  I have prescribed Bactrim DS One tablet twice a day for 5 days.  Your symptoms should gradually improve. Call us if the burning in your urine worsens, you develop worsening fever, back pain or pelvic pain or if your symptoms do not resolve after completing the antibiotic.  I have also sent Diflucan for possible yeast infection from the antibiotics.  Urinary tract infections can be prevented by drinking plenty of water to keep your body hydrated.  Also be sure when you wipe, wipe from front to back and don't hold it in!  If possible, empty your bladder every 4 hours.  HOME CARE Drink plenty of fluids Compete the full course of the antibiotics even if the symptoms resolve Remember, when you need to go.go. Holding in your urine can increase the likelihood of getting a UTI! GET HELP RIGHT AWAY IF: You cannot urinate You get a high fever Worsening back pain occurs You see blood in your urine You feel sick to your stomach or throw up You feel like you are going to pass out  MAKE SURE YOU  Understand these instructions. Will watch your condition. Will get help right away if you are not doing well or get worse.   Thank you for choosing an e-visit.  Your e-visit answers were reviewed by a board certified advanced clinical practitioner to complete your personal care plan. Depending upon the condition, your plan could have included both over the counter or prescription medications.  Please review your pharmacy choice. Make sure the pharmacy is  open so you can pick up prescription now. If there is a problem, you may contact your provider through [employer] and have the prescription routed to another pharmacy.  Your safety is important to [place]. If you have drug allergies check your prescription carefully.   For the next 24 hours you can use MyChart to ask questions about today's visit, request a non-urgent call back, or ask for a work or school excuse. You will get an email in the next two days asking about your experience. I hope that your e-visit has been valuable and will speed your recovery.   I have spent 5 minutes in review of e-visit questionnaire, review and updating patient chart, medical decision making and response to patient.   Mar Daring, PA-C

## 2022-08-16 ENCOUNTER — Telehealth: Payer: No Typology Code available for payment source | Admitting: Physician Assistant

## 2022-08-16 DIAGNOSIS — R3989 Other symptoms and signs involving the genitourinary system: Secondary | ICD-10-CM

## 2022-08-16 DIAGNOSIS — B379 Candidiasis, unspecified: Secondary | ICD-10-CM

## 2022-08-16 DIAGNOSIS — T3695XA Adverse effect of unspecified systemic antibiotic, initial encounter: Secondary | ICD-10-CM | POA: Diagnosis not present

## 2022-08-17 MED ORDER — CEPHALEXIN 500 MG PO CAPS: 500.0000 mg | ORAL_CAPSULE | Freq: Two times a day (BID) | ORAL | 0 refills | Status: DC

## 2022-08-17 MED ORDER — FLUCONAZOLE 200 MG PO TABS: 200.0000 mg | ORAL_TABLET | Freq: Once | ORAL | 0 refills | Status: AC

## 2022-08-17 NOTE — Progress Notes (Signed)
E-Visit for Urinary Problems  We are sorry that you are not feeling well.  Here is how we plan to help!  Based on what you shared with me it looks like you most likely have a simple urinary tract infection.  A UTI (Urinary Tract Infection) is a bacterial infection of the bladder.  Most cases of urinary tract infections are simple to treat but a key part of your care is to encourage you to drink plenty of fluids and watch your symptoms carefully.  I have prescribed Keflex 500 mg twice a day for 7 days.  Your symptoms should gradually improve. Call us if the burning in your urine worsens, you develop worsening fever, back pain or pelvic pain or if your symptoms do not resolve after completing the antibiotic.  Urinary tract infections can be prevented by drinking plenty of water to keep your body hydrated.  Also be sure when you wipe, wipe from front to back and don't hold it in!  If possible, empty your bladder every 4 hours.  I also prescribed Fluconazole '200mg'$ . Take once antibiotic completed.  HOME CARE Drink plenty of fluids Compete the full course of the antibiotics even if the symptoms resolve Remember, when you need to go.go. Holding in your urine can increase the likelihood of getting a UTI! GET HELP RIGHT AWAY IF: You cannot urinate You get a high fever Worsening back pain occurs You see blood in your urine You feel sick to your stomach or throw up You feel like you are going to pass out  MAKE SURE YOU  Understand these instructions. Will watch your condition. Will get help right away if you are not doing well or get worse.   Thank you for choosing an e-visit.  Your e-visit answers were reviewed by a board certified advanced clinical practitioner to complete your personal care plan. Depending upon the condition, your plan could have included both over the counter or prescription medications.  Please review your pharmacy choice. Make sure the pharmacy is open so you can  pick up prescription now. If there is a problem, you may contact your provider through [employer] and have the prescription routed to another pharmacy.  Your safety is important to [place]. If you have drug allergies check your prescription carefully.   For the next 24 hours you can use MyChart to ask questions about today's visit, request a non-urgent call back, or ask for a work or school excuse. You will get an email in the next two days asking about your experience. I hope that your e-visit has been valuable and will speed your recovery.  I have spent 5 minutes in review of e-visit questionnaire, review and updating patient chart, medical decision making and response to patient.   Mar Daring, PA-C

## 2022-11-01 ENCOUNTER — Telehealth: Payer: No Typology Code available for payment source | Admitting: Emergency Medicine

## 2022-11-01 DIAGNOSIS — N760 Acute vaginitis: Secondary | ICD-10-CM

## 2022-11-01 DIAGNOSIS — B9689 Other specified bacterial agents as the cause of diseases classified elsewhere: Secondary | ICD-10-CM | POA: Diagnosis not present

## 2022-11-01 MED ORDER — METRONIDAZOLE 500 MG PO TABS: 500.0000 mg | ORAL_TABLET | Freq: Two times a day (BID) | ORAL | 0 refills | Status: AC

## 2022-11-01 NOTE — Progress Notes (Signed)
E-Visit for Vaginal Symptoms  We are sorry that you are not feeling well. Here is how we plan to help! Based on what you shared with me it looks like you: May have a vaginosis due to bacteria  Vaginosis is an inflammation of the vagina that can result in discharge, itching and pain. The cause is usually a change in the normal balance of vaginal bacteria or an infection. Vaginosis can also result from reduced estrogen levels after menopause.  The most common causes of vaginosis are:   Bacterial vaginosis which results from an overgrowth of one on several organisms that are normally present in your vagina.   Yeast infections which are caused by a naturally occurring fungus called candida.   Vaginal atrophy (atrophic vaginosis) which results from the thinning of the vagina from reduced estrogen levels after menopause.   Trichomoniasis which is caused by a parasite and is commonly transmitted by sexual intercourse.  Factors that increase your risk of developing vaginosis include: Medications, such as antibiotics and steroids Uncontrolled diabetes Use of hygiene products such as bubble bath, vaginal spray or vaginal deodorant Douching Wearing damp or tight-fitting clothing Using an intrauterine device (IUD) for birth control Hormonal changes, such as those associated with pregnancy, birth control pills or menopause Sexual activity Having a sexually transmitted infection  Your treatment plan is Metronidazole or Flagyl 500mg twice a day for 7 days.  I have electronically sent this prescription into the pharmacy that you have chosen.  Be sure to take all of the medication as directed. Stop taking any medication if you develop a rash, tongue swelling or shortness of breath. Mothers who are breast feeding should consider pumping and discarding their breast milk while on these antibiotics. However, there is no consensus that infant exposure at these doses would be harmful.  Remember that  medication creams can weaken latex condoms. .   HOME CARE:  Good hygiene may prevent some types of vaginosis from recurring and may relieve some symptoms:  Avoid baths, hot tubs and whirlpool spas. Rinse soap from your outer genital area after a shower, and dry the area well to prevent irritation. Don't use scented or harsh soaps, such as those with deodorant or antibacterial action. Avoid irritants. These include scented tampons and pads. Wipe from front to back after using the toilet. Doing so avoids spreading fecal bacteria to your vagina.  Other things that may help prevent vaginosis include:  Don't douche. Your vagina doesn't require cleansing other than normal bathing. Repetitive douching disrupts the normal organisms that reside in the vagina and can actually increase your risk of vaginal infection. Douching won't clear up a vaginal infection. Use a latex condom. Both female and female latex condoms may help you avoid infections spread by sexual contact. Wear cotton underwear. Also wear pantyhose with a cotton crotch. If you feel comfortable without it, skip wearing underwear to bed. Yeast thrives in moist environments Your symptoms should improve in the next day or two.  GET HELP RIGHT AWAY IF:  You have pain in your lower abdomen ( pelvic area or over your ovaries) You develop nausea or vomiting You develop a fever Your discharge changes or worsens You have persistent pain with intercourse You develop shortness of breath, a rapid pulse, or you faint.  These symptoms could be signs of problems or infections that need to be evaluated by a medical provider now.  MAKE SURE YOU   Understand these instructions. Will watch your condition. Will get help right   away if you are not doing well or get worse.  Thank you for choosing an e-visit.  Your e-visit answers were reviewed by a board certified advanced clinical practitioner to complete your personal care plan. Depending upon the  condition, your plan could have included both over the counter or prescription medications.  Please review your pharmacy choice. Make sure the pharmacy is open so you can pick up prescription now. If there is a problem, you may contact your provider through [employer] and have the prescription routed to another pharmacy.  Your safety is important to [place]. If you have drug allergies check your prescription carefully.   For the next 24 hours you can use MyChart to ask questions about today's visit, request a non-urgent call back, or ask for a work or school excuse. You will get an email in the next two days asking about your experience. I hope that your e-visit has been valuable and will speed your recovery.   I have spent 5 minutes in review of e-visit questionnaire, review and updating patient chart, medical decision making and response to patient.   Carvel Getting, NP

## 2022-12-21 ENCOUNTER — Ambulatory Visit (INDEPENDENT_AMBULATORY_CARE_PROVIDER_SITE_OTHER): Payer: No Typology Code available for payment source

## 2022-12-21 VITALS — BP 110/76 | Ht 72.0 in | Wt 242.0 lb

## 2022-12-21 DIAGNOSIS — R399 Unspecified symptoms and signs involving the genitourinary system: Secondary | ICD-10-CM

## 2022-12-21 LAB — POCT URINALYSIS DIPSTICK
Bilirubin, UA: NEGATIVE
Glucose, UA: NEGATIVE
Ketones, UA: NEGATIVE
Nitrite, UA: NEGATIVE
Protein, UA: POSITIVE — AB
Spec Grav, UA: 1.01 (ref 1.010–1.025)
Urobilinogen, UA: 0.2 E.U./dL
pH, UA: 5 (ref 5.0–8.0)

## 2022-12-21 NOTE — Progress Notes (Signed)
    NURSE VISIT NOTE  Subjective:    Patient ID: Daisy Foley, female    DOB: [birth year], 42 y.o.   MRN: [id number]       HPI  Patient is a 42 y.o. [id number] female who presents for UTI symptoms for the past 5 days. Her symptoms are cloudy urine and a slight pink color at the end of urine stream. She denies having frequency and burning urinating, and/or low back pain. She has also been having on and off left side pelvic pain for the last couple of weeks. Patient does not have a history of recurrent UTI.  Patient does not have a history of pyelonephritis.    Objective:    BP 110/76   Ht 6' (1.829 m)   Wt 242 lb (109.8 kg)   BMI 32.82 kg/m    Lab Review  Results for orders placed or performed in visit on 12/21/22  POCT Urinalysis Dipstick  Result Value Ref Range   Color, UA     Clarity, UA     Glucose, UA Negative Negative   Bilirubin, UA neg    Ketones, UA neg    Spec Grav, UA 1.010 1.010 - 1.025   Blood, UA 3+    pH, UA 5.0 5.0 - 8.0   Protein, UA Positive (A) Negative   Urobilinogen, UA 0.2 0.2 or 1.0 E.U./dL   Nitrite, UA neg    Leukocytes, UA Moderate (2+) (A) Negative   Appearance     Odor      Assessment:   1. Symptoms of urinary tract infection      Plan:   Urine Culture Sent. Maintain adequate hydration.  May use AZO OTC prn.  Follow up if symptoms worsen or fail to improve as anticipated, and as needed.  Patient will wait for results to be treated properly.   Drenda Freeze, CMA

## 2022-12-21 NOTE — Patient Instructions (Signed)
Urinary Tract Infection, Adult A urinary tract infection (UTI) is an infection of any part of the urinary tract. The urinary tract includes: The kidneys. The ureters. The bladder. The urethra. These organs make, store, and get rid of pee (urine) in the body. What are the causes? This infection is caused by germs (bacteria) in your genital area. These germs grow and cause swelling (inflammation) of your urinary tract. What increases the risk? The following factors may make you more likely to develop this condition: Using a small, thin tube (catheter) to drain pee. Not being able to control when you pee or poop (incontinence). Being female. If you are female, these things can increase the risk: Using these methods to prevent pregnancy: A medicine that kills sperm (spermicide). A device that blocks sperm (diaphragm). Having low levels of a female hormone (estrogen). Being pregnant. You are more likely to develop this condition if: You have genes that add to your risk. You are sexually active. You take antibiotic medicines. You have trouble peeing because of: A prostate that is bigger than normal, if you are female. A blockage in the part of your body that drains pee from the bladder. A kidney stone. A nerve condition that affects your bladder. Not getting enough to drink. Not peeing often enough. You have other conditions, such as: Diabetes. A weak disease-fighting system (immune system). Sickle cell disease. Gout. Injury of the spine. What are the signs or symptoms? Symptoms of this condition include: Needing to pee right away. Peeing small amounts often. Pain or burning when peeing. Blood in the pee. Pee that smells bad or not like normal. Trouble peeing. Pee that is cloudy. Fluid coming from the vagina, if you are female. Pain in the belly or lower back. Other symptoms include: Vomiting. Not feeling hungry. Feeling mixed up (confused). This may be the first symptom in  older adults. Being tired and grouchy (irritable). A fever. Watery poop (diarrhea). How is this treated? Taking antibiotic medicine. Taking other medicines. Drinking enough water. In some cases, you may need to see a specialist. Follow these instructions at home:  Medicines Take over-the-counter and prescription medicines only as told by your doctor. If you were prescribed an antibiotic medicine, take it as told by your doctor. Do not stop taking it even if you start to feel better. General instructions Make sure you: Pee until your bladder is empty. Do not hold pee for a long time. Empty your bladder after sex. Wipe from front to back after peeing or pooping if you are a female. Use each tissue one time when you wipe. Drink enough fluid to keep your pee pale yellow. Keep all follow-up visits. Contact a doctor if: You do not get better after 1-2 days. Your symptoms go away and then come back. Get help right away if: You have very bad back pain. You have very bad pain in your lower belly. You have a fever. You have chills. You feeling like you will vomit or you vomit. Summary A urinary tract infection (UTI) is an infection of any part of the urinary tract. This condition is caused by germs in your genital area. There are many risk factors for a UTI. Treatment includes antibiotic medicines. Drink enough fluid to keep your pee pale yellow. This information is not intended to replace advice given to you by your health care provider. Make sure you discuss any questions you have with your health care provider. Document Revised: 04/17/2020 Document Reviewed: 04/17/2020 Elsevier Patient Education    2023 [employer]  

## 2022-12-26 LAB — URINE CULTURE

## 2022-12-27 ENCOUNTER — Other Ambulatory Visit: Payer: Self-pay

## 2022-12-27 DIAGNOSIS — N390 Urinary tract infection, site not specified: Secondary | ICD-10-CM

## 2022-12-27 MED ORDER — NITROFURANTOIN MONOHYD MACRO 100 MG PO CAPS: 100.0000 mg | ORAL_CAPSULE | Freq: Two times a day (BID) | ORAL | 0 refills | Status: DC

## 2023-04-25 ENCOUNTER — Ambulatory Visit: Payer: No Typology Code available for payment source | Admitting: Nurse Practitioner

## 2023-05-19 ENCOUNTER — Ambulatory Visit: Payer: No Typology Code available for payment source | Admitting: Nurse Practitioner

## 2023-06-21 ENCOUNTER — Other Ambulatory Visit (HOSPITAL_COMMUNITY)
Admission: RE | Admit: 2023-06-21 | Discharge: 2023-06-21 | Disposition: A | Discharge status: Home / Self Care | Payer: No Typology Code available for payment source | Source: Ambulatory Visit | Attending: Obstetrics and Gynecology | Admitting: Obstetrics and Gynecology

## 2023-06-21 ENCOUNTER — Ambulatory Visit: Payer: No Typology Code available for payment source | Admitting: Obstetrics and Gynecology

## 2023-06-21 ENCOUNTER — Encounter: Payer: Self-pay | Admitting: Obstetrics and Gynecology

## 2023-06-21 VITALS — BP 126/84 | HR 77 | Ht 72.0 in | Wt 242.1 lb

## 2023-06-21 DIAGNOSIS — B009 Herpesviral infection, unspecified: Secondary | ICD-10-CM

## 2023-06-21 DIAGNOSIS — Z01419 Encounter for gynecological examination (general) (routine) without abnormal findings: Secondary | ICD-10-CM | POA: Diagnosis present

## 2023-06-21 DIAGNOSIS — Z23 Encounter for immunization: Secondary | ICD-10-CM

## 2023-06-21 DIAGNOSIS — Z124 Encounter for screening for malignant neoplasm of cervix: Secondary | ICD-10-CM

## 2023-06-21 DIAGNOSIS — Z1231 Encounter for screening mammogram for malignant neoplasm of breast: Secondary | ICD-10-CM

## 2023-06-21 MED ORDER — ACYCLOVIR 400 MG PO TABS: 400.0000 mg | ORAL_TABLET | Freq: Two times a day (BID) | ORAL | 1 refills | Status: AC | PRN

## 2023-06-21 NOTE — Progress Notes (Signed)
HPI:      [name] is a 42 y.o. [id number] who LMP was No LMP recorded. Patient has had a hysterectomy.  Subjective:   She presents today for her annual examination.  States that she continues to have occasional spotting on some months which represents her menses.  She has had a previous supracervical hysterectomy for severe endometriosis and uterine fibroids.  She has no further pain and is very happy with this occasional spotting compared to how it used to be. She was found to have a elevated A1c last year and she is now taking metformin and dieting and controlling her sugars. She takes episodic acyclovir for recurrent HSV.    Hx: The following portions of the patient's history were reviewed and updated as appropriate:             She  has a past medical history of Endometriosis/adenomyosis, Fibroid, and Genital herpes. She does not have any pertinent problems on file. She  has a past surgical history that includes Endometrial ablation; Dilation and curettage of uterus; Pelvic laparoscopy; Laparoscopic supracervical hysterectomy bilateral salpingectomy (2015); and Breast biopsy (Right, 05/01/2019). Her family history includes Bipolar disorder in her mother; Depression in her mother; Diabetes in her maternal aunt, maternal uncle, and mother; Hypertension in her mother. She  reports that she has never smoked. She has never used smokeless tobacco. She reports current alcohol use. She reports that she does not use drugs. She has a current medication list which includes the following prescription(s): acyclovir, acyclovir, ciclopirox, cyclobenzaprine, fluticasone, gabapentin, mometasone, bupropion, and sertraline. She has No Known Allergies.       Review of Systems:  Review of Systems  Constitutional: Denied constitutional symptoms, night sweats, recent illness, fatigue, fever, insomnia and weight loss.  Eyes: Denied eye symptoms, eye pain, photophobia, vision change and visual  disturbance.  Ears/Nose/Throat/Neck: Denied ear, nose, throat or neck symptoms, hearing loss, nasal discharge, sinus congestion and sore throat.  Cardiovascular: Denied cardiovascular symptoms, arrhythmia, chest pain/pressure, edema, exercise intolerance, orthopnea and palpitations.  Respiratory: Denied pulmonary symptoms, asthma, pleuritic pain, productive sputum, cough, dyspnea and wheezing.  Gastrointestinal: Denied, gastro-esophageal reflux, melena, nausea and vomiting.  Genitourinary: Denied genitourinary symptoms including symptomatic vaginal discharge, pelvic relaxation issues, and urinary complaints.  Musculoskeletal: Denied musculoskeletal symptoms, stiffness, swelling, muscle weakness and myalgia.  Dermatologic: Denied dermatology symptoms, rash and scar.  Neurologic: Denied neurology symptoms, dizziness, headache, neck pain and syncope.  Psychiatric: Denied psychiatric symptoms, anxiety and depression.  Endocrine: Denied endocrine symptoms including hot flashes and night sweats.   Meds:   Current Outpatient Medications on File Prior to Visit  Medication Sig Dispense Refill   acyclovir (ZOVIRAX) 400 MG tablet TAKE 1 TABLET(400 MG) BY MOUTH TWICE DAILY 90 tablet 3   Ciclopirox 1 % shampoo APPLY 5 TO 10 ML TOPICALLY TO THE SCALP 2 TIMES A WEEK FOR UP TO 4 WEEKS     cyclobenzaprine (FLEXERIL) 5 MG tablet TAKE 1 TABLET(5 MG) BY MOUTH THREE TIMES DAILY AS NEEDED FOR MUSCLE SPASMS 30 tablet 10   fluticasone (FLONASE) 50 MCG/ACT nasal spray Place 2 sprays into both nostrils daily. 16 g 3   gabapentin (NEURONTIN) 100 MG capsule TAKE 1 CAPSULE(100 MG) BY MOUTH THREE TIMES DAILY 360 capsule 1   mometasone (ELOCON) 0.1 % lotion INSTILL 4 DROPS TOPICALLY INTO EAR CANAL AT BEDTIME AS NEEDED FOR DRY SKIN / ITCHING     buPROPion (WELLBUTRIN XL) 150 MG 24 hr tablet Take 1 tablet (150 mg total)  by mouth daily. (Patient not taking: Reported on 06/21/2023) 90 tablet 2   sertraline (ZOLOFT) 50 MG tablet  TAKE 1 TABLET(50 MG) BY MOUTH DAILY (Patient not taking: Reported on 06/21/2023) 90 tablet 3   No current facility-administered medications on file prior to visit.     Objective:     Vitals:   06/21/23 1134  BP: 126/84  Pulse: 77    Filed Weights   06/21/23 1134  Weight: 242 lb 1.6 oz (109.8 kg)              Physical examination   Pelvic:   Vulva: Normal appearance.  No lesions.  Vagina: No lesions or abnormalities noted.  Support: Normal pelvic support.  Urethra No masses tenderness or scarring.  Meatus Normal size without lesions or prolapse.  Cervix: Normal appearance.  No lesions.  Anus: Normal exam.  No lesions.  Perineum: Normal exam.  No lesions.        Bimanual   Uterus: Surgically absent  Adnexae: No masses.  Non-tender to palpation.  Cul-de-sac: Negative for abnormality.     Assessment:    [id number] Patient Active Problem List   Diagnosis Date Noted   Foot trauma, left, subsequent encounter 05/23/2018   Abnormal vaginal bleeding 01/09/2017   STD exposure 01/09/2017   ADD (attention deficit disorder) 06/29/2015   Clinical depression 06/29/2015   Esophagitis, reflux 06/29/2015   Adult hypothyroidism 06/29/2015   Headache, migraine 06/29/2015   Pure hypercholesterolemia 06/29/2015   Allergic rhinitis 07/16/2009   Essential (primary) hypertension 07/16/2009   Genital herpes 02/10/2009     1. Well woman exam with routine gynecological exam   2. Screening mammogram for breast cancer   3. Screening for cervical cancer   4. Encounter for immunization   5. HSV infection        Plan:            1.  Basic Screening Recommendations The basic screening recommendations for asymptomatic women were discussed with the patient during her visit.  The age-appropriate recommendations were discussed with her and the rational for the tests reviewed.  When I am informed by the patient that another primary care physician has previously obtained the age-appropriate  tests and they are up-to-date, only outstanding tests are ordered and referrals given as necessary.  Abnormal results of tests will be discussed with her when all of her results are completed.  Routine preventative health maintenance measures emphasized: Exercise/Diet/Weight control, Tobacco Warnings, Alcohol/Substance use risks and Stress Management Pap performed -mammogram ordered  2.  Acyclovir refilled  3.  Happy and encouraged patient to continue her diabetes control.  Orders Orders Placed This Encounter  Procedures   MM DIGITAL SCREENING BILATERAL   Flu vaccine trivalent PF, 6mos and older(Flulaval,Afluria,Fluarix,Fluzone)     Meds ordered this encounter  Medications   acyclovir (ZOVIRAX) 400 MG tablet    Sig: Take 1 tablet (400 mg total) by mouth 2 (two) times daily as needed for up to 20 days.    Dispense:  20 tablet    Refill:  1            F/U  Return in about 1 year (around 06/20/2024) for Annual Physical.  Elonda Husky, M.D. 06/21/2023 11:57 AM

## 2023-06-21 NOTE — Progress Notes (Signed)
Patients presents for annual exam today. Patient states continuing to have light spotting. History of supracervical hysterectomy. Due for pap smear and mammogram, ordered. Flu shot administered. She states no other questions or concerns at this time.

## 2023-06-27 ENCOUNTER — Other Ambulatory Visit: Payer: Self-pay

## 2023-06-27 DIAGNOSIS — B9689 Other specified bacterial agents as the cause of diseases classified elsewhere: Secondary | ICD-10-CM

## 2023-06-27 LAB — CYTOLOGY - PAP
Comment: NEGATIVE
Diagnosis: NEGATIVE
Diagnosis: REACTIVE
High risk HPV: NEGATIVE

## 2023-06-27 MED ORDER — METRONIDAZOLE 500 MG PO TABS
500.0000 mg | ORAL_TABLET | Freq: Two times a day (BID) | ORAL | 0 refills | Status: AC
Start: 2023-06-27 — End: ?

## 2023-07-21 ENCOUNTER — Ambulatory Visit
Admission: RE | Admit: 2023-07-21 | Discharge: 2023-07-21 | Disposition: A | Discharge status: Home / Self Care | Payer: No Typology Code available for payment source | Source: Ambulatory Visit | Attending: Obstetrics and Gynecology | Admitting: Obstetrics and Gynecology

## 2023-07-21 DIAGNOSIS — Z01419 Encounter for gynecological examination (general) (routine) without abnormal findings: Secondary | ICD-10-CM

## 2023-07-21 DIAGNOSIS — Z1231 Encounter for screening mammogram for malignant neoplasm of breast: Secondary | ICD-10-CM | POA: Insufficient documentation

## 2023-07-25 ENCOUNTER — Other Ambulatory Visit: Payer: Self-pay

## 2024-11-18 ENCOUNTER — Ambulatory Visit: Admitting: Registered Nurse

## 2024-11-22 ENCOUNTER — Encounter: Admitting: Family Medicine
